# Patient Record
Sex: Female | Born: 1970 | Race: White | Hispanic: No | Smoking: Never smoker
Health system: Southern US, Community
[De-identification: ages and names within clinical notes are randomized; demographics above are authoritative.]

## PROBLEM LIST (undated history)

## (undated) ENCOUNTER — Inpatient Hospital Stay: Admission: EM | Payer: Self-pay | Source: Home / Self Care

## (undated) DIAGNOSIS — J45909 Unspecified asthma, uncomplicated: Secondary | ICD-10-CM

## (undated) HISTORY — PX: NO PAST SURGERIES: SHX2092

---

## 2005-07-19 ENCOUNTER — Emergency Department: Payer: Self-pay | Admitting: Emergency Medicine

## 2008-03-08 ENCOUNTER — Emergency Department: Payer: Self-pay | Admitting: Emergency Medicine

## 2016-02-21 ENCOUNTER — Emergency Department: Payer: BC Managed Care – PPO

## 2016-02-21 ENCOUNTER — Emergency Department
Admission: EM | Admit: 2016-02-21 | Discharge: 2016-02-21 | Disposition: A | Discharge status: Home / Self Care | Payer: BC Managed Care – PPO | Attending: Internal Medicine | Admitting: Internal Medicine

## 2016-02-21 DIAGNOSIS — R42 Dizziness and giddiness: Secondary | ICD-10-CM | POA: Insufficient documentation

## 2016-02-21 LAB — URINALYSIS COMPLETE WITH MICROSCOPIC (ARMC ONLY)
BILIRUBIN URINE: NEGATIVE
Bacteria, UA: NONE SEEN
GLUCOSE, UA: NEGATIVE mg/dL
Leukocytes, UA: NEGATIVE
Nitrite: NEGATIVE
PH: 5 (ref 5.0–8.0)
Protein, ur: 100 mg/dL — AB
Specific Gravity, Urine: 1.023 (ref 1.005–1.030)

## 2016-02-21 LAB — BASIC METABOLIC PANEL
ANION GAP: 6 (ref 5–15)
BUN: 18 mg/dL (ref 6–20)
CO2: 28 mmol/L (ref 22–32)
Calcium: 9.6 mg/dL (ref 8.9–10.3)
Chloride: 104 mmol/L (ref 101–111)
Creatinine, Ser: 0.79 mg/dL (ref 0.44–1.00)
GFR calc Af Amer: >60 mL/min (ref >60)
GFR calc non Af Amer: >60 mL/min (ref >60)
GLUCOSE: 101 mg/dL — AB (ref 65–99)
POTASSIUM: 4.1 mmol/L (ref 3.5–5.1)
Sodium: 138 mmol/L (ref 135–145)

## 2016-02-21 LAB — CBC
HEMATOCRIT: 41.2 % (ref 35.0–47.0)
HEMOGLOBIN: 13.6 g/dL (ref 12.0–16.0)
MCH: 27.4 pg (ref 26.0–34.0)
MCHC: 32.9 g/dL (ref 32.0–36.0)
MCV: 83.2 fL (ref 80.0–100.0)
PLATELETS: 185 10*3/uL (ref 150–440)
RBC: 4.95 MIL/uL (ref 3.80–5.20)
RDW: 14.3 % (ref 11.5–14.5)
WBC: 12 10*3/uL — ABNORMAL HIGH (ref 3.6–11.0)

## 2016-02-21 LAB — POCT PREGNANCY, URINE: PREG TEST UR: NEGATIVE

## 2016-02-21 LAB — GLUCOSE, CAPILLARY: GLUCOSE-CAPILLARY: 87 mg/dL (ref 65–99)

## 2016-02-21 NOTE — Discharge Instructions (Signed)

## 2016-02-21 NOTE — ED Provider Notes (Signed)
[facility] Emergency Department Provider Note     Time seen: ----------------------------------------- 10:05 PM on 02/21/2016 -----------------------------------------    I have reviewed the triage vital signs and the nursing notes.   HISTORY  Chief Complaint Dizziness    HPI Erin [name] is a 45 y.o. female who presents to the ER feeling somewhat feverish and not well since yesterday. Patient states symptoms recurred today around 11 AM, she recently has changed her diet and is exercising. Last meal was at 2 PM. Patient states she attempted workouts afternoon and had a near syncopal event. She states still feels weak and dizzy, is only eating fresh fruits and vegetables. She states she's lost 5 pounds in the last 3 weeks. Patient states she now she is overweight, she does not have any medical problems or take any medications.   No past medical history on file.  There are no active problems to display for this patient.   No past surgical history on file.  Allergies Review of patient's allergies indicates no known allergies.  Social History Social History  Substance Use Topics  . Smoking status: Not on file  . Smokeless tobacco: Not on file  . Alcohol Use: Not on file    Review of Systems Constitutional: Negative for fever. Eyes: Negative for visual changes. ENT: Negative for sore throat. Cardiovascular: Negative for chest pain. Respiratory: Negative for shortness of breath. Gastrointestinal: Negative for abdominal pain, vomiting and diarrhea. Genitourinary: Negative for dysuria. Musculoskeletal: Negative for back pain. Skin: Negative for rash. Neurological: Negative for headaches, positive for dizziness and weakness  10-point ROS otherwise negative.  ____________________________________________   PHYSICAL EXAM:  VITAL SIGNS: ED Triage Vitals  Enc Vitals Group     BP 02/21/16 1942 156/96 mmHg     Pulse Rate 02/21/16 1942 95      Resp 02/21/16 1942 20     Temp 02/21/16 1942 98.8 F (37.1 C)     Temp Source 02/21/16 1942 Oral     SpO2 02/21/16 1942 100 %     Weight 02/21/16 1942 223 lb (101.152 kg)     Height 02/21/16 1942 5' (1.524 m)     Head Cir --      Peak Flow --      Pain Score 02/21/16 1943 0     Pain Loc --      Pain Edu? --      Excl. in GC? --     Constitutional: Alert and oriented. Well appearing and in no distress. Eyes: Conjunctivae are normal. PERRL. Normal extraocular movements. ENT   Head: Normocephalic and atraumatic.   Nose: No congestion/rhinnorhea.   Mouth/Throat: Mucous membranes are moist.   Neck: No stridor. Cardiovascular: Normal rate, regular rhythm. Normal and symmetric distal pulses are present in all extremities. No murmurs, rubs, or gallops. Respiratory: Normal respiratory effort without tachypnea nor retractions. Breath sounds are clear and equal bilaterally. No wheezes/rales/rhonchi. Gastrointestinal: Soft and nontender. No distention. No abdominal bruits.  Musculoskeletal: Nontender with normal range of motion in all extremities. No joint effusions.  No lower extremity tenderness nor edema. Neurologic:  Normal speech and language. No gross focal neurologic deficits are appreciated. Speech is normal. No gait instability. Skin:  Skin is warm, dry and intact. No rash noted. Psychiatric: Mood and affect are normal. Speech and behavior are normal. Patient exhibits appropriate insight and judgment. ____________________________________________  EKG: Interpreted by me. Normal sinus rhythm with a rate of 75 bpm, normal PR interval, normal rest, normal  QT interval. Incomplete right bundle branch block, voltage criteria for LVH. ____________________________________________  ED COURSE:  Pertinent labs & imaging results that were available during my care of the patient were reviewed by me and considered in my medical decision making (see chart for details). Patient is no  acute distress, will check cardiac labs and chest x-ray. ____________________________________________    LABS (pertinent positives/negatives)  Labs Reviewed  BASIC METABOLIC PANEL - Abnormal; Notable for the following:    Glucose, Bld 101 (*)    All other components within normal limits  CBC - Abnormal; Notable for the following:    WBC 12.0 (*)    All other components within normal limits  URINALYSIS COMPLETEWITH MICROSCOPIC ([facility] ONLY) - Abnormal; Notable for the following:    Color, Urine YELLOW (*)    APPearance HAZY (*)    Ketones, ur TRACE (*)    Hgb urine dipstick 2+ (*)    Protein, ur 100 (*)    Squamous Epithelial / LPF 0-5 (*)    All other components within normal limits  GLUCOSE, CAPILLARY  CBG MONITORING, ED  POC URINE PREG, ED  POCT PREGNANCY, URINE    RADIOLOGY Images were viewed by me  Chest x-ray Is unremarkable ____________________________________________  FINAL ASSESSMENT AND PLAN  Dizziness  Plan: Patient with labs and imaging as dictated above. Patient was somewhat abnormal EKG that suggested LVH. I advised her no strenuous exercise and she'll be referred to cardiology to follow up as an outpatient. She is in no acute distress and stable for outpatient follow-up.   Emily Filbert, MD   Emily Filbert, MD 02/21/16 2222

## 2016-02-21 NOTE — ED Notes (Signed)
Pt states that she started feeling somewhat feverish and not well yesterday and it returned this am around 1100, pt states that she is on a new diet and last meal was at 1400, pt states that she attempted to work out this afternoon and nearly passed out, pt states that she feels weak and dizzy

## 2016-02-24 DIAGNOSIS — R42 Dizziness and giddiness: Secondary | ICD-10-CM | POA: Insufficient documentation

## 2016-02-24 DIAGNOSIS — R55 Syncope and collapse: Secondary | ICD-10-CM | POA: Insufficient documentation

## 2016-05-31 ENCOUNTER — Ambulatory Visit: Payer: BC Managed Care – PPO

## 2018-04-09 ENCOUNTER — Inpatient Hospital Stay
Admission: EM | Admit: 2018-04-09 | Discharge: 2018-04-10 | DRG: 193 | Disposition: A | Discharge status: Home / Self Care | Payer: BC Managed Care – PPO | Attending: Internal Medicine | Admitting: Internal Medicine

## 2018-04-09 ENCOUNTER — Other Ambulatory Visit: Payer: Self-pay

## 2018-04-09 ENCOUNTER — Encounter: Payer: Self-pay | Admitting: Emergency Medicine

## 2018-04-09 ENCOUNTER — Emergency Department: Payer: BC Managed Care – PPO

## 2018-04-09 DIAGNOSIS — J189 Pneumonia, unspecified organism: Principal | ICD-10-CM | POA: Diagnosis present

## 2018-04-09 DIAGNOSIS — J9600 Acute respiratory failure, unspecified whether with hypoxia or hypercapnia: Secondary | ICD-10-CM | POA: Diagnosis present

## 2018-04-09 DIAGNOSIS — Z79899 Other long term (current) drug therapy: Secondary | ICD-10-CM

## 2018-04-09 DIAGNOSIS — J4541 Moderate persistent asthma with (acute) exacerbation: Secondary | ICD-10-CM | POA: Diagnosis not present

## 2018-04-09 DIAGNOSIS — J9601 Acute respiratory failure with hypoxia: Secondary | ICD-10-CM

## 2018-04-09 HISTORY — DX: Unspecified asthma, uncomplicated: J45.909

## 2018-04-09 LAB — CBC
HEMATOCRIT: 40.2 % (ref 35.0–47.0)
HEMOGLOBIN: 13.8 g/dL (ref 12.0–16.0)
MCH: 27.9 pg (ref 26.0–34.0)
MCHC: 34.3 g/dL (ref 32.0–36.0)
MCV: 81.4 fL (ref 80.0–100.0)
Platelets: 205 10*3/uL (ref 150–440)
RBC: 4.94 MIL/uL (ref 3.80–5.20)
RDW: 14.4 % (ref 11.5–14.5)
WBC: 7.1 10*3/uL (ref 3.6–11.0)

## 2018-04-09 LAB — BASIC METABOLIC PANEL
ANION GAP: 10 (ref 5–15)
BUN: 11 mg/dL (ref 6–20)
CHLORIDE: 100 mmol/L — AB (ref 101–111)
CO2: 26 mmol/L (ref 22–32)
Calcium: 9.2 mg/dL (ref 8.9–10.3)
Creatinine, Ser: 0.73 mg/dL (ref 0.44–1.00)
GFR calc Af Amer: >60 mL/min (ref >60)
GFR calc non Af Amer: >60 mL/min (ref >60)
GLUCOSE: 126 mg/dL — AB (ref 65–99)
Potassium: 4 mmol/L (ref 3.5–5.1)
Sodium: 136 mmol/L (ref 135–145)

## 2018-04-09 LAB — TROPONIN I: Troponin I: <0.03 ng/mL (ref <0.03)

## 2018-04-09 LAB — POCT PREGNANCY, URINE: PREG TEST UR: NEGATIVE

## 2018-04-09 MED ORDER — IPRATROPIUM-ALBUTEROL 0.5-2.5 (3) MG/3ML IN SOLN
3.0000 mL | RESPIRATORY_TRACT | Status: DC
  Administered 2018-04-09 – 2018-04-10 (×6): 3 mL via RESPIRATORY_TRACT
  Filled 2018-04-09 (×6): qty 3

## 2018-04-09 MED ORDER — SODIUM CHLORIDE 0.9 % IV SOLN
1.0000 g | Freq: Every day | INTRAVENOUS | Status: DC
  Filled 2018-04-09: qty 10

## 2018-04-09 MED ORDER — DOCUSATE SODIUM 100 MG PO CAPS: 100.0000 mg | ORAL_CAPSULE | Freq: Two times a day (BID) | ORAL | Status: DC | PRN

## 2018-04-09 MED ORDER — LORATADINE 10 MG PO TABS
10.0000 mg | ORAL_TABLET | Freq: Every day | ORAL | Status: DC
Start: 2018-04-10 — End: 2018-04-10

## 2018-04-09 MED ORDER — BUDESONIDE 0.25 MG/2ML IN SUSP
0.2500 mg | Freq: Two times a day (BID) | RESPIRATORY_TRACT | Status: DC
  Administered 2018-04-10: 0.25 mg via RESPIRATORY_TRACT
  Filled 2018-04-09 (×2): qty 2

## 2018-04-09 MED ORDER — HEPARIN SODIUM (PORCINE) 5000 UNIT/ML IJ SOLN
5000.0000 [IU] | Freq: Three times a day (TID) | INTRAMUSCULAR | Status: DC
  Filled 2018-04-09: qty 1

## 2018-04-09 MED ORDER — METHYLPREDNISOLONE SODIUM SUCC 125 MG IJ SOLR
INTRAMUSCULAR | Status: AC
  Administered 2018-04-09: 125 mg via INTRAVENOUS
  Filled 2018-04-09: qty 2

## 2018-04-09 MED ORDER — DEXTROSE 5 % IV SOLN
250.0000 mg | Freq: Every day | INTRAVENOUS | Status: DC
  Filled 2018-04-09: qty 250

## 2018-04-09 MED ORDER — METHYLPREDNISOLONE SODIUM SUCC 125 MG IJ SOLR
60.0000 mg | Freq: Four times a day (QID) | INTRAMUSCULAR | Status: DC
  Administered 2018-04-09 – 2018-04-10 (×3): 60 mg via INTRAVENOUS
  Filled 2018-04-09 (×3): qty 2

## 2018-04-09 MED ORDER — METHYLPREDNISOLONE SODIUM SUCC 125 MG IJ SOLR
125.0000 mg | Freq: Once | INTRAMUSCULAR | Status: AC
  Administered 2018-04-09: 125 mg via INTRAVENOUS

## 2018-04-09 MED ORDER — IPRATROPIUM-ALBUTEROL 0.5-2.5 (3) MG/3ML IN SOLN
3.0000 mL | Freq: Once | RESPIRATORY_TRACT | Status: AC
  Administered 2018-04-09: 3 mL via RESPIRATORY_TRACT

## 2018-04-09 MED ORDER — AZITHROMYCIN 500 MG IV SOLR
500.0000 mg | Freq: Once | INTRAVENOUS | Status: AC
  Administered 2018-04-09: 500 mg via INTRAVENOUS
  Filled 2018-04-09: qty 500

## 2018-04-09 MED ORDER — IPRATROPIUM-ALBUTEROL 0.5-2.5 (3) MG/3ML IN SOLN
RESPIRATORY_TRACT | Status: AC
  Administered 2018-04-09: 3 mL via RESPIRATORY_TRACT
  Filled 2018-04-09: qty 3

## 2018-04-09 MED ORDER — IOPAMIDOL (ISOVUE-370) INJECTION 76%
75.0000 mL | Freq: Once | INTRAVENOUS | Status: AC | PRN
  Administered 2018-04-09: 60 mL via INTRAVENOUS

## 2018-04-09 MED ORDER — SODIUM CHLORIDE 0.9 % IV SOLN: 1.0000 g | Freq: Every day | INTRAVENOUS | Status: DC

## 2018-04-09 MED ORDER — SODIUM CHLORIDE 0.9 % IV SOLN
1.0000 g | Freq: Once | INTRAVENOUS | Status: AC
  Administered 2018-04-09: 1 g via INTRAVENOUS
  Filled 2018-04-09: qty 10

## 2018-04-09 NOTE — ED Provider Notes (Signed)
[facility] Emergency Department Provider Note   ____________________________________________    I have reviewed the triage vital signs and the nursing notes.   HISTORY  Chief Complaint Fever and Wheezing     HPI Erin [name] is a 47 y.o. female who presents with complaints of shortness of breath.  Patient reports a history of asthma typically has exacerbations in the fall and spring.  She reports she has been cleaning the house and thinks that the chemicals or dust may have exacerbated her asthma.  She complains of chills as well and suspect she has had a fever over the last 2 days.  Mild chest tightness, no nausea or vomiting or diaphoresis.  Has not taken anything for this   Past Medical History:  Diagnosis Date  . Asthma     There are no active problems to display for this patient.     Prior to Admission medications   Medication Sig Start Date End Date Taking? Authorizing Provider  albuterol (PROVENTIL HFA;VENTOLIN HFA) 108 (90 Base) MCG/ACT inhaler Inhale 1-2 puffs into the lungs every 6 (six) hours as needed for wheezing or shortness of breath.   Yes [provider]  fexofenadine (ALLEGRA) 180 MG tablet Take 180 mg by mouth daily.   Yes [provider]     Allergies Percocet [oxycodone-acetaminophen]  No family history on file.  Social History Does not smoke Occasional alcohol use  Review of Systems  Constitutional: Positive chills Eyes: No visual changes.  ENT: No sore throat. Cardiovascular: Mild chest tightness Respiratory: As above Gastrointestinal: No abdominal pain.  No nausea, no vomiting.   Genitourinary: Negative for dysuria. Musculoskeletal: Negative for back pain. Skin: Negative for rash. Neurological: Negative for headaches    ____________________________________________   PHYSICAL EXAM:  VITAL SIGNS: ED Triage Vitals  Enc Vitals Group     BP 04/09/18 0940 (!) 159/90     Pulse  Rate 04/09/18 0940 (!) 118     Resp 04/09/18 0940 16     Temp 04/09/18 0940 99.6 F (37.6 C)     Temp Source 04/09/18 0940 Oral     SpO2 04/09/18 0940 93 %     Weight 04/09/18 0939 113.4 kg (250 lb)     Height 04/09/18 0939 1.524 m (5')     Head Circumference --      Peak Flow --      Pain Score 04/09/18 0938 0     Pain Loc --      Pain Edu? --      Excl. in GC? --     Constitutional: Alert and oriented. No acute distress. Pleasant and interactive Eyes: Conjunctivae are normal.   Nose: No congestion/rhinnorhea. Mouth/Throat: Mucous membranes are moist.   Cardiovascular: Normal rate, regular rhythm. Grossly normal heart sounds.  Good peripheral circulation. Respiratory: Normal respiratory effort.  No retractions.  Diffuse wheezing Gastrointestinal: Soft and nontender. No distention.  No CVA tenderness.  Musculoskeletal: No lower extremity tenderness nor edema.  Warm and well perfused Neurologic:  Normal speech and language. No gross focal neurologic deficits are appreciated.  Skin:  Skin is warm, dry and intact. No rash noted. Psychiatric: Mood and affect are normal. Speech and behavior are normal.  ____________________________________________   LABS (all labs ordered are listed, but only abnormal results are displayed)  Labs Reviewed  BASIC METABOLIC PANEL - Abnormal; Notable for the following components:      Result Value   Chloride 100 (*)  Glucose, Bld 126 (*)    All other components within normal limits  CULTURE, BLOOD (ROUTINE X 2)  CULTURE, BLOOD (ROUTINE X 2)  CBC  TROPONIN I  POC URINE PREG, ED  POCT PREGNANCY, URINE   ____________________________________________  EKG  ED ECG REPORT I, Jene Every, the attending physician, personally viewed and interpreted this ECG.  Date: 04/09/2018  Rate: 114 Rhythm: Sinus tachycardia QRS Axis: normal Intervals: normal ST/T Wave abnormalities: normal Narrative Interpretation: no evidence of acute  ischemia  ____________________________________________  RADIOLOGY  Chest x-ray shows nodular appearance of the right hilum, will obtain CT ____________________________________________   PROCEDURES  Procedure(s) performed: No  Procedures   Critical Care performed: No ____________________________________________   INITIAL IMPRESSION / ASSESSMENT AND PLAN / ED COURSE  Pertinent labs & imaging results that were available during my care of the patient were reviewed by me and considered in my medical decision making (see chart for details).  Patient presents with shortness of breath and wheezing consistent with asthma exacerbation but also describes chills raising a concern for infection versus pneumonia.  Chest x-ray with nodular hilum will obtain CT, treat with Solu-Medrol IV, duo nebs and reevaluate  CT scan demonstrates pneumonia, patient remains with increased work of breathing, tachycardic, pulse oximetry in the low 90s.  Will admit to the hospital service, IV Rocephin IV azithromycin    ____________________________________________   FINAL CLINICAL IMPRESSION(S) / ED DIAGNOSES  Final diagnoses:  Community acquired pneumonia of right lung, unspecified part of lung  Moderate persistent asthma with exacerbation        Note:  This document was prepared using Dragon voice recognition software and may include unintentional dictation errors.    Jene Every, MD 04/09/18 1341

## 2018-04-09 NOTE — ED Triage Notes (Signed)
C/O wheezing, SOB since Friday. Also C/O productive cough.  Using nebulizer every 4 hours.

## 2018-04-09 NOTE — ED Notes (Signed)
Attempted to call for triage. Pt remains in bathroom. Will triage when done.

## 2018-04-09 NOTE — H&P (Signed)
[employer] [facility] [facility] [place] [facility]   PATIENT NAME: Erin [name]    MR#:  [id number]  DATE OF BIRTH:  [birth year]  DATE OF ADMISSION:  04/09/2018  PRIMARY CARE PHYSICIAN: Patient, No Pcp Per   REQUESTING/REFERRING PHYSICIAN: Kinner  CHIEF COMPLAINT:   Chief Complaint  Patient presents with  . Fever  . Wheezing    HISTORY OF PRESENT ILLNESS: Erin [name]  is a 47 y.o. female with a known history of asthma- at her baseline has shortness of breath with minimal exertion and she tried to limit her activity is secondary to that. She still her to use her rescue inhalers most of the days. For last 3-4 days she is worsening in shortness of breath with some cough and productive sputum. She also have a low-grade fever and chills. She continued to use her nebulizer albuterol at home, but it was still not helping and so finally came to emergency room. Noted to havesome infiltrate on CT scan of the chest and she was tachycardic and tachypneic so given to hospitalist team for further management of respiratory failure, pneumonia, asthma.  PAST MEDICAL HISTORY:   Past Medical History:  Diagnosis Date  . Asthma     PAST SURGICAL HISTORY: C section.  SOCIAL HISTORY:  Social History   Tobacco Use  . Smoking status: Never Smoker  . Smokeless tobacco: Never Used  Substance Use Topics  . Alcohol use: Not Currently    FAMILY HISTORY:  Family History  Problem Relation Age of Onset  . Diabetes Sister     DRUG ALLERGIES:  Allergies  Allergen Reactions  . Percocet [Oxycodone-Acetaminophen] Itching    "Under the skin bugs"    REVIEW OF SYSTEMS:   CONSTITUTIONAL: positive for fever, fatigue or weakness.  EYES: No blurred or double vision.  EARS, NOSE, AND THROAT: No tinnitus or ear pain.  RESPIRATORY: have cough, shortness of breath, wheezing ,no hemoptysis.  CARDIOVASCULAR: No chest pain, orthopnea, edema.  GASTROINTESTINAL: No nausea, vomiting, diarrhea or  abdominal pain.  GENITOURINARY: No dysuria, hematuria.  ENDOCRINE: No polyuria, nocturia,  HEMATOLOGY: No anemia, easy bruising or bleeding SKIN: No rash or lesion. MUSCULOSKELETAL: No joint pain or arthritis.   NEUROLOGIC: No tingling, numbness, weakness.  PSYCHIATRY: No anxiety or depression.   MEDICATIONS AT HOME:  Prior to Admission medications   Medication Sig Start Date End Date Taking? Authorizing Provider  albuterol (PROVENTIL HFA;VENTOLIN HFA) 108 (90 Base) MCG/ACT inhaler Inhale 1-2 puffs into the lungs every 6 (six) hours as needed for wheezing or shortness of breath.   Yes [provider]  fexofenadine (ALLEGRA) 180 MG tablet Take 180 mg by mouth daily.   Yes [provider]      PHYSICAL EXAMINATION:   VITAL SIGNS: Blood pressure 136/83, pulse (!) 104, temperature 99.6 F (37.6 C), temperature source Oral, resp. rate (!) 21, height 5' (1.524 m), weight 113.4 kg (250 lb), last menstrual period 02/26/2018, SpO2 90 %.  GENERAL:  47 y.o.-year-old patient lying in the bed with no acute distress.  EYES: Pupils equal, round, reactive to light and accommodation. No scleral icterus. Extraocular muscles intact.  HEENT: [name] atraumatic, normocephalic. Oropharynx and nasopharynx clear.  NECK:  Supple, no jugular venous distention. No thyroid enlargement, no tenderness.  LUNGS: Normal breath sounds bilaterally, b/l wheezing, no crepitation. No use of accessory muscles of respiration.  CARDIOVASCULAR: S1, S2 normal. No murmurs, rubs, or gallops.  ABDOMEN: Soft, nontender, nondistended. Bowel sounds present. No organomegaly or mass.  EXTREMITIES: No pedal edema, cyanosis, or clubbing.  NEUROLOGIC: Cranial nerves II through XII are intact. Muscle strength 5/5 in all extremities. Sensation intact. Gait not checked.  PSYCHIATRIC: The patient is alert and oriented x 3.  SKIN: No obvious rash, lesion, or ulcer.   LABORATORY PANEL:   CBC Recent Labs  Lab 04/09/18 0951   WBC 7.1  HGB 13.8  HCT 40.2  PLT 205  MCV 81.4  MCH 27.9  MCHC 34.3  RDW 14.4   ------------------------------------------------------------------------------------------------------------------  Chemistries  Recent Labs  Lab 04/09/18 0951  NA 136  K 4.0  CL 100*  CO2 26  GLUCOSE 126*  BUN 11  CREATININE 0.73  CALCIUM 9.2   ------------------------------------------------------------------------------------------------------------------ estimated creatinine clearance is 101.9 mL/min (by C-G formula based on SCr of 0.73 mg/dL). ------------------------------------------------------------------------------------------------------------------ No results for input(s): TSH, T4TOTAL, T3FREE, THYROIDAB in the last 72 hours.  Invalid input(s): FREET3   Coagulation profile No results for input(s): INR, PROTIME in the last 168 hours. ------------------------------------------------------------------------------------------------------------------- No results for input(s): DDIMER in the last 72 hours. -------------------------------------------------------------------------------------------------------------------  Cardiac Enzymes Recent Labs  Lab 04/09/18 0951  TROPONINI <0.03   ------------------------------------------------------------------------------------------------------------------ Invalid input(s): POCBNP  ---------------------------------------------------------------------------------------------------------------  Urinalysis    Component Value Date/Time   COLORURINE YELLOW (A) 02/21/2016 1945   APPEARANCEUR HAZY (A) 02/21/2016 1945   LABSPEC 1.023 02/21/2016 1945   PHURINE 5.0 02/21/2016 1945   GLUCOSEU NEGATIVE 02/21/2016 1945   HGBUR 2+ (A) 02/21/2016 1945   BILIRUBINUR NEGATIVE 02/21/2016 1945   KETONESUR TRACE (A) 02/21/2016 1945   PROTEINUR 100 (A) 02/21/2016 1945   NITRITE NEGATIVE 02/21/2016 1945   LEUKOCYTESUR NEGATIVE 02/21/2016 1945      RADIOLOGY: Dg Chest 2 View  Result Date: 04/09/2018 CLINICAL DATA:  Shortness of breath.  Productive cough.  Wheezing. EXAM: CHEST - 2 VIEW COMPARISON:  02/21/2016 FINDINGS: There is abnormal enlargement of the right hilum. Slight peribronchial thickening. The lungs are otherwise clear. No effusions. Heart size and vascularity are normal. No acute bone abnormality. 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 IMPRESSION: 1. Nodular appearance in the right hilum, new since the prior exam. CT scan of the chest with contrast recommended for further characterization. 2. Slight bronchitic changes. Electronically Signed   By: Francene Boyers M.D.   On: 04/09/2018 10:24   Ct Chest W Contrast  Result Date: 04/09/2018 CLINICAL DATA:  Shortness of breath, productive cough. EXAM: CT CHEST WITH CONTRAST TECHNIQUE: Multidetector CT imaging of the chest was performed during intravenous contrast administration. CONTRAST:  60mL ISOVUE-370 IOPAMIDOL (ISOVUE-370) INJECTION 76% COMPARISON:  Radiographs of same day. FINDINGS: Cardiovascular: No significant vascular findings. Normal heart size. No pericardial effusion. Mediastinum/Nodes: No enlarged mediastinal, hilar, or axillary lymph nodes. Thyroid gland, trachea, and esophagus demonstrate no significant findings. Lungs/Pleura: No pneumothorax or pleural effusion is noted. Left lung is clear. Airspace opacity is noted medially and posteriorly in right lower lobe most consistent with pneumonia or possibly atelectasis. 1 cm irregular density is noted more superiorly in the right lower lobe most consistent with focal inflammation, although neoplasm cannot be excluded. Upper Abdomen: No acute abnormality. Musculoskeletal: No chest wall abnormality. No acute or significant osseous findings. IMPRESSION: Airspace opacity is noted medially and posteriorly in right lower lobe most consistent with  pneumonia or possibly subsegmental atelectasis. 1 cm irregular density is noted more superiorly in right lower lobe which may represent focal inflammation, but neoplasm cannot be excluded. Follow-up unenhanced chest CT  in 6-8 weeks is recommended to ensure resolution and rule out underlying malignancy. Electronically Signed   By: Lupita Raider, M.D.   On: 04/09/2018 12:12    EKG: Orders placed or performed during the hospital encounter of 04/09/18  . ED EKG within 10 minutes  . ED EKG within 10 minutes  . EKG 12-Lead  . EKG 12-Lead    IMPRESSION AND PLAN:  * acute hypoxic respiratory   Secondary to community-acquired pneumonia   Asthma exacerbation    IV and inhaled steroids, inhaled nebulizer therapy   Antibiotics.   Supplemental oxygen for now and try to taper.   As her asthma symptoms arerequiring her to use rescue inhaler almost daily and limiting her day to day activities, she may need long-acting inhalers on discharge. And may need to have a pulmonary clinic follow-up  * lung nodule   This is incidental finding on CT scan, need to follow up in few months as outpatient.  All the records are reviewed and case discussed with ED provider. Management plans discussed with the patient, family and they are in agreement.  CODE STATUS: full.    TOTAL TIME TAKING CARE OF THIS PATIENT: 45 minutes.    Altamese Dilling M.D on 04/09/2018   Between 7am to 6pm - Pager - [phone]  After 6pm go to [email or web address] - password [email or web address]  [facility]  Office  [phone]  CC: Primary care physician; Patient, No Pcp Per   Note: This dictation was prepared with Dragon dictation along with smaller phrase technology. Any transcriptional errors that result from this process are unintentional.

## 2018-04-10 LAB — CBC
HCT: 37.3 % (ref 35.0–47.0)
HEMOGLOBIN: 13 g/dL (ref 12.0–16.0)
MCH: 28.2 pg (ref 26.0–34.0)
MCHC: 34.8 g/dL (ref 32.0–36.0)
MCV: 81 fL (ref 80.0–100.0)
PLATELETS: 168 10*3/uL (ref 150–440)
RBC: 4.6 MIL/uL (ref 3.80–5.20)
RDW: 14.5 % (ref 11.5–14.5)
WBC: 6.5 10*3/uL (ref 3.6–11.0)

## 2018-04-10 LAB — BASIC METABOLIC PANEL
ANION GAP: 12 (ref 5–15)
BUN: 15 mg/dL (ref 6–20)
CALCIUM: 9.3 mg/dL (ref 8.9–10.3)
CO2: 25 mmol/L (ref 22–32)
CREATININE: 0.75 mg/dL (ref 0.44–1.00)
Chloride: 101 mmol/L (ref 101–111)
Glucose, Bld: 216 mg/dL — ABNORMAL HIGH (ref 65–99)
Potassium: 3.9 mmol/L (ref 3.5–5.1)
Sodium: 138 mmol/L (ref 135–145)

## 2018-04-10 MED ORDER — ALBUTEROL SULFATE HFA 108 (90 BASE) MCG/ACT IN AERS: 2.0000 | INHALATION_SPRAY | Freq: Four times a day (QID) | RESPIRATORY_TRACT | 2 refills | Status: DC | PRN

## 2018-04-10 MED ORDER — METHYLPREDNISOLONE SODIUM SUCC 40 MG IJ SOLR
40.0000 mg | Freq: Two times a day (BID) | INTRAMUSCULAR | Status: DC
  Administered 2018-04-10: 13:00:00 40 mg via INTRAVENOUS
  Filled 2018-04-10: qty 1

## 2018-04-10 MED ORDER — PREDNISONE 10 MG PO TABS: 10.0000 mg | ORAL_TABLET | Freq: Every day | ORAL | 0 refills | Status: DC

## 2018-04-10 MED ORDER — AZITHROMYCIN 500 MG PO TABS: 500.0000 mg | ORAL_TABLET | Freq: Every day | ORAL | 0 refills | Status: AC

## 2018-04-10 MED ORDER — IPRATROPIUM-ALBUTEROL 0.5-2.5 (3) MG/3ML IN SOLN: 3.0000 mL | Freq: Four times a day (QID) | RESPIRATORY_TRACT | 0 refills | Status: DC | PRN

## 2018-04-10 MED ORDER — FLUTICASONE-SALMETEROL 250-50 MCG/DOSE IN AEPB: 1.0000 | INHALATION_SPRAY | Freq: Two times a day (BID) | RESPIRATORY_TRACT | 11 refills | Status: AC

## 2018-04-10 MED ORDER — CEFUROXIME AXETIL 500 MG PO TABS: 500.0000 mg | ORAL_TABLET | Freq: Two times a day (BID) | ORAL | 0 refills | Status: DC

## 2018-04-10 MED ORDER — TIOTROPIUM BROMIDE MONOHYDRATE 18 MCG IN CAPS
18.0000 ug | ORAL_CAPSULE | Freq: Every morning | RESPIRATORY_TRACT | Status: DC
  Administered 2018-04-10: 13:00:00 18 ug via RESPIRATORY_TRACT
  Filled 2018-04-10: qty 5

## 2018-04-10 NOTE — Progress Notes (Signed)
   04/10/18 1025  Clinical Encounter Type  Visited With Patient  Visit Type Initial  Referral From Nurse  Consult/Referral To Chaplain  Spiritual Encounters  Spiritual Needs Brochure;Emotional   CH received an OR to educate PT for an AD. PT received paperwork and will complete during her stay. CH and PT discussed her breathing problems. PT was very positive about future. CH will follow up with PT.

## 2018-04-10 NOTE — Progress Notes (Signed)
Discharge instructions given and went over with patient at bedside. Prescriptions given and reviewed. All questions answered. Patient verbalized understanding. Patient discharged home via wheelchair by volunteer services. Wessley Emert S, RN  

## 2018-04-10 NOTE — Progress Notes (Signed)
SATURATION QUALIFICATIONS: (This note is used to comply with regulatory documentation for home oxygen)  Patient Saturations on Room Air at Rest = 92%  Patient Saturations on Room Air while Ambulating = 89%  Bo Mcclintock, RN

## 2018-04-10 NOTE — Progress Notes (Signed)
Inpatient Diabetes Program Recommendations  AACE/ADA: New Consensus Statement on Inpatient Glycemic Control (2015)  Target Ranges:  Prepandial:   less than 140 mg/dL      Peak postprandial:   less than 180 mg/dL (1-2 hours)      Critically ill patients:  140 - 180 mg/dL   Lab Results  Component Value Date   GLUCAP 87 02/21/2016    Review of Glycemic ControlResults for Erin [name], Erin [name] (MRN [id number]) as of 04/10/2018 11:09  Ref. Range 04/09/2018 09:51 04/10/2018 04:52  Glucose Latest Ref Range: 65 - 99 mg/dL 409 (H) 811 (H)    Diabetes history: None Outpatient Diabetes medications:  None Current orders for Inpatient glycemic control:  Solumedrol 40 mg IV q 12 hours Inpatient Diabetes Program Recommendations:   Note blood sugars increased with IV steroids.  Consider adding Novolog moderate correction tid with meals and HS while on IV steroids and in the hospital.   Thanks,  Beryl Meager, RN, BC-ADM Inpatient Diabetes Coordinator Pager [phone] (8a-5p)

## 2018-04-10 NOTE — Discharge Summary (Addendum)
[employer] -  at [facility]   PATIENT NAME: Erin [name]    MR#:  [id number]  DATE OF BIRTH:  01-19-1971  DATE OF ADMISSION:  04/09/2018 ADMITTING PHYSICIAN: Altamese Dilling, MD  DATE OF DISCHARGE: April 10, 2018  PRIMARY CARE PHYSICIAN: [facility]    ADMISSION DIAGNOSIS:  Moderate persistent asthma with exacerbation [J45.41] Community acquired pneumonia of right lung, unspecified part of lung [J18.9]  DISCHARGE DIAGNOSIS:  Principal Problem:   Acute respiratory failure with hypoxia (HCC) Active Problems:   Acute respiratory failure (HCC)   SECONDARY DIAGNOSIS:   Past Medical History:  Diagnosis Date  . Asthma     HOSPITAL COURSE:   47 year old female with a history of childhood asthma who presents with shortness of breath and cough.  1.  Acute hypoxic respiratory failure in the setting of community-acquired pneumonia with mild exacerbation of asthma.  2.  Community acquired pneumonia: Patient will be discharged on oral Ceftin and azithromycin.  3.  Mild exacerbation of asthma: Patient will be discharged on prednisone taper I have also ordered Advair and rescue inhaler with albuterol.   4. 1 cm irregular density is noted more superiorly in right lower lobe which may represent focal inflammation, but neoplasm cannot be excluded. Follow-up unenhanced chest CT in 6-8 weeks is recommended to ensure resolution and rule out underlying malignancy  DISCHARGE CONDITIONS AND DIET:   Stable for discharge on regular diet  CONSULTS OBTAINED:    DRUG ALLERGIES:   Allergies  Allergen Reactions  . Percocet [Oxycodone-Acetaminophen] Itching    "Under the skin bugs"    DISCHARGE MEDICATIONS:   Allergies as of 04/10/2018      Reactions   Percocet [oxycodone-acetaminophen] Itching   "Under the skin bugs"      Medication List    TAKE these medications   albuterol 108 (90 Base) MCG/ACT inhaler Commonly known as:  PROVENTIL  HFA;VENTOLIN HFA Inhale 1-2 puffs into the lungs every 6 (six) hours as needed for wheezing or shortness of breath. What changed:  Another medication with the same name was added. Make sure you understand how and when to take each.   albuterol 108 (90 Base) MCG/ACT inhaler Commonly known as:  PROVENTIL HFA;VENTOLIN HFA Inhale 2 puffs into the lungs every 6 (six) hours as needed for wheezing or shortness of breath. What changed:  You were already taking a medication with the same name, and this prescription was added. Make sure you understand how and when to take each.   azithromycin 500 MG tablet Commonly known as:  ZITHROMAX Take 1 tablet (500 mg total) by mouth daily for 4 days. Take 1 tablet daily for 4 days.   cefUROXime 500 MG tablet Commonly known as:  CEFTIN Take 1 tablet (500 mg total) by mouth 2 (two) times daily with a meal.   fexofenadine 180 MG tablet Commonly known as:  ALLEGRA Take 180 mg by mouth daily.   Fluticasone-Salmeterol 250-50 MCG/DOSE Aepb Commonly known as:  ADVAIR DISKUS Inhale 1 puff into the lungs 2 (two) times daily.   predniSONE 10 MG tablet Commonly known as:  DELTASONE Take 1 tablet (10 mg total) by mouth daily with breakfast. 60 mg PO (ORAL) x 2 days 50 mg PO (ORAL)  x 2 days 40 mg PO (ORAL)  x 2 days 30 mg PO  (ORAL)  x 2 days 20 mg PO  (ORAL) x 2 days 10 mg PO  (ORAL) x 2 days then stop  Durable Medical Equipment  (From admission, onward)        Start     Ordered   04/10/18 1123  DME Oxygen  Once    Question Answer Comment  Mode or (Route) Nasal cannula   Liters per Minute 2   Frequency Continuous (stationary and portable oxygen unit needed)   Oxygen conserving device Yes   Oxygen delivery system Gas      04/10/18 1122        Today   CHIEF COMPLAINT:   No acute events overnight.  Patient reports no shortness of breath.  She did ambulate and her oxygen level was low.  She prefers to go home with home oxygen if  needed.  She does not want to stay in the hospital.   VITAL SIGNS:  Blood pressure (!) 146/85, pulse (!) 124, temperature 99 F (37.2 C), temperature source Oral, resp. rate 20, height 5' (1.524 m), weight 113.4 kg (250 lb), last menstrual period 02/26/2018, SpO2 95 %.   REVIEW OF SYSTEMS:  Review of Systems  Constitutional: Negative.  Negative for chills, fever and malaise/fatigue.  HENT: Negative.  Negative for ear discharge, ear pain, hearing loss, nosebleeds and sore throat.   Eyes: Negative.  Negative for blurred vision and pain.  Respiratory: Positive for cough. Negative for hemoptysis, shortness of breath and wheezing.   Cardiovascular: Negative.  Negative for chest pain, palpitations and leg swelling.  Gastrointestinal: Negative.  Negative for abdominal pain, blood in stool, diarrhea, nausea and vomiting.  Genitourinary: Negative.  Negative for dysuria.  Musculoskeletal: Negative.  Negative for back pain.  Skin: Negative.   Neurological: Negative for dizziness, tremors, speech change, focal weakness, seizures and headaches.  Endo/Heme/Allergies: Negative.  Does not bruise/bleed easily.  Psychiatric/Behavioral: Negative.  Negative for depression, hallucinations and suicidal ideas.     PHYSICAL EXAMINATION:  GENERAL:  47 y.o.-year-old patient lying in the bed with no acute distress.  NECK:  Supple, no jugular venous distention. No thyroid enlargement, no tenderness.  LUNGS: Normal breath sounds bilaterally, no wheezing, rales,rhonchi  No use of accessory muscles of respiration.  CARDIOVASCULAR: S1, S2 normal. No murmurs, rubs, or gallops.  ABDOMEN: Soft, non-tender, non-distended. Bowel sounds present. No organomegaly or mass.  EXTREMITIES: No pedal edema, cyanosis, or clubbing.  PSYCHIATRIC: The patient is alert and oriented x 3.  SKIN: No obvious rash, lesion, or ulcer.   DATA REVIEW:   CBC Recent Labs  Lab 04/10/18 0452  WBC 6.5  HGB 13.0  HCT 37.3  PLT 168     Chemistries  Recent Labs  Lab 04/10/18 0452  NA 138  K 3.9  CL 101  CO2 25  GLUCOSE 216*  BUN 15  CREATININE 0.75  CALCIUM 9.3    Cardiac Enzymes Recent Labs  Lab 04/09/18 0951  TROPONINI <0.03    Microbiology Results  @  RADIOLOGY:  Dg Chest 2 View  Result Date: 04/09/2018 CLINICAL DATA:  Shortness of breath.  Productive cough.  Wheezing. EXAM: CHEST - 2 VIEW COMPARISON:  02/21/2016 FINDINGS: There is abnormal enlargement of the right hilum. Slight peribronchial thickening. The lungs are otherwise clear. No effusions. Heart size and vascularity are normal. No acute bone abnormality. 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 IMPRESSION: 1. Nodular appearance in the right hilum, new since the prior exam. CT scan of the chest with contrast recommended for further characterization. 2. Slight bronchitic changes. Electronically Signed  By: Francene Boyers M.D.   On: 04/09/2018 10:24   Ct Chest W Contrast  Result Date: 04/09/2018 CLINICAL DATA:  Shortness of breath, productive cough. EXAM: CT CHEST WITH CONTRAST TECHNIQUE: Multidetector CT imaging of the chest was performed during intravenous contrast administration. CONTRAST:  60mL ISOVUE-370 IOPAMIDOL (ISOVUE-370) INJECTION 76% COMPARISON:  Radiographs of same day. FINDINGS: Cardiovascular: No significant vascular findings. Normal heart size. No pericardial effusion. Mediastinum/Nodes: No enlarged mediastinal, hilar, or axillary lymph nodes. Thyroid gland, trachea, and esophagus demonstrate no significant findings. Lungs/Pleura: No pneumothorax or pleural effusion is noted. Left lung is clear. Airspace opacity is noted medially and posteriorly in right lower lobe most consistent with pneumonia or possibly atelectasis. 1 cm irregular density is noted more superiorly in the right lower lobe most consistent with focal inflammation, although  neoplasm cannot be excluded. Upper Abdomen: No acute abnormality. Musculoskeletal: No chest wall abnormality. No acute or significant osseous findings. IMPRESSION: Airspace opacity is noted medially and posteriorly in right lower lobe most consistent with pneumonia or possibly subsegmental atelectasis. 1 cm irregular density is noted more superiorly in right lower lobe which may represent focal inflammation, but neoplasm cannot be excluded. Follow-up unenhanced chest CT in 6-8 weeks is recommended to ensure resolution and rule out underlying malignancy. Electronically Signed   By: Lupita Raider, M.D.   On: 04/09/2018 12:12      Allergies as of 04/10/2018      Reactions   Percocet [oxycodone-acetaminophen] Itching   "Under the skin bugs"      Medication List    TAKE these medications   albuterol 108 (90 Base) MCG/ACT inhaler Commonly known as:  PROVENTIL HFA;VENTOLIN HFA Inhale 1-2 puffs into the lungs every 6 (six) hours as needed for wheezing or shortness of breath. What changed:  Another medication with the same name was added. Make sure you understand how and when to take each.   albuterol 108 (90 Base) MCG/ACT inhaler Commonly known as:  PROVENTIL HFA;VENTOLIN HFA Inhale 2 puffs into the lungs every 6 (six) hours as needed for wheezing or shortness of breath. What changed:  You were already taking a medication with the same name, and this prescription was added. Make sure you understand how and when to take each.   azithromycin 500 MG tablet Commonly known as:  ZITHROMAX Take 1 tablet (500 mg total) by mouth daily for 4 days. Take 1 tablet daily for 4 days.   cefUROXime 500 MG tablet Commonly known as:  CEFTIN Take 1 tablet (500 mg total) by mouth 2 (two) times daily with a meal.   fexofenadine 180 MG tablet Commonly known as:  ALLEGRA Take 180 mg by mouth daily.   Fluticasone-Salmeterol 250-50 MCG/DOSE Aepb Commonly known as:  ADVAIR DISKUS Inhale 1 puff into the lungs 2  (two) times daily.   predniSONE 10 MG tablet Commonly known as:  DELTASONE Take 1 tablet (10 mg total) by mouth daily with breakfast. 60 mg PO (ORAL) x 2 days 50 mg PO (ORAL)  x 2 days 40 mg PO (ORAL)  x 2 days 30 mg PO  (ORAL)  x 2 days 20 mg PO  (ORAL) x 2 days 10 mg PO  (ORAL) x 2 days then stop            Durable Medical Equipment  (From admission, onward)        Start     Ordered   04/10/18 1123  DME Oxygen  Once    Question  Answer Comment  Mode or (Route) Nasal cannula   Liters per Minute 2   Frequency Continuous (stationary and portable oxygen unit needed)   Oxygen conserving device Yes   Oxygen delivery system Gas      04/10/18 1122        Management plans discussed with the patient and she is in agreement. Stable for discharge   Patient should follow up with pcp  CODE STATUS:     Code Status Orders  (From admission, onward)        Start     Ordered   04/09/18 1508  Full code  Continuous     04/09/18 1507    Code Status History    This patient has a current code status but no historical code status.      TOTAL TIME TAKING CARE OF THIS PATIENT: 38 minutes.    Note: This dictation was prepared with Dragon dictation along with smaller phrase technology. Any transcriptional errors that result from this process are unintentional.  Eureka Valdes M.D on 04/10/2018 at 11:24 AM  Between 7am to 6pm - Pager - [phone] After 6pm go to [email or web address] - Social research officer, government  [facility]  Office  [phone]  CC: Primary care physician; [facility]

## 2018-04-11 LAB — HIV ANTIBODY (ROUTINE TESTING W REFLEX): HIV SCREEN 4TH GENERATION: NONREACTIVE

## 2018-04-14 LAB — CULTURE, BLOOD (ROUTINE X 2)
CULTURE: NO GROWTH
Culture: NO GROWTH
Special Requests: ADEQUATE
Special Requests: ADEQUATE

## 2018-04-16 ENCOUNTER — Telehealth: Payer: Self-pay | Admitting: Licensed Clinical Social Worker

## 2018-04-16 NOTE — Telephone Encounter (Signed)
EMMI flagged patient for answering yes to feeling sad/hopeless/anxious/empty and yes to loss of interest in things. Clinical Child psychotherapist (CSW) attempted to contact patient via telephone however she did not answer and a voicemail was left.   [employer], LCSW [phone]

## 2018-04-17 ENCOUNTER — Telehealth: Payer: Self-pay | Admitting: Licensed Clinical Social Worker

## 2018-04-17 NOTE — Telephone Encounter (Signed)
EMMI flagged patient for answering yes to feeling sad/hopeless/anxious/empty and yes to loss of interest in things. Clinical Child psychotherapist (CSW) was able to reach patient via telephone on the second attempt today. Patient reported that she is doing good and is feeling much better. Patient reported that she is going to see her PCP today. Patient reported that she is not feeling sad or depressed and she thought she answered no to that question. Patient reported no needs or concerns. No future call is needed.   [employer], LCSW [phone]

## 2018-09-08 IMAGING — CT CT CHEST W/ CM
2 of 3 series · 15 of 36 positions shown, 18 images · IV contrast (iopamidol)
Comparison: Radiographs of same day.

CLINICAL DATA: Shortness of breath, productive cough.

EXAM:
CT CHEST WITH CONTRAST
TECHNIQUE: Multidetector CT imaging of the chest was performed during
intravenous contrast administration.
CONTRAST:  60mL ENFJ3O-YHY IOPAMIDOL (ENFJ3O-YHY) INJECTION 76%

[Series 2: axial st · axial · 0.66mm/px · z∈[-310,-82]mm · 12 of 134 slices shown, 15 images]
[im 10/134  mediastinal]
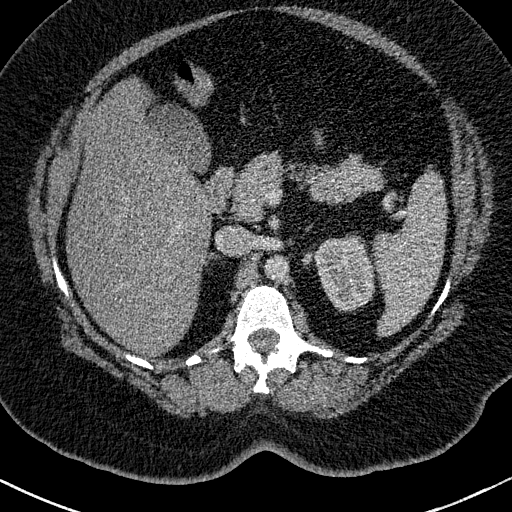
[im 10/134  lung]
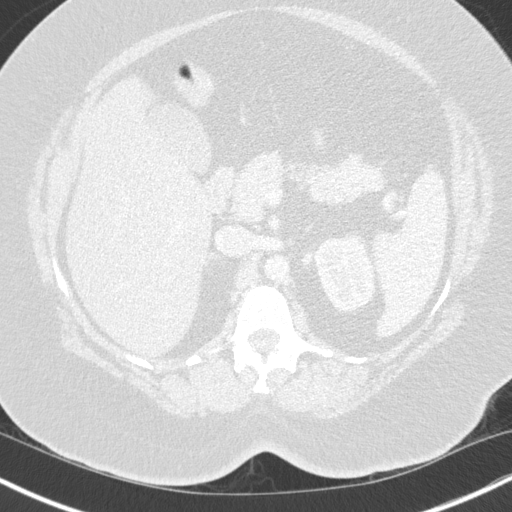
[im 20/134  lung]
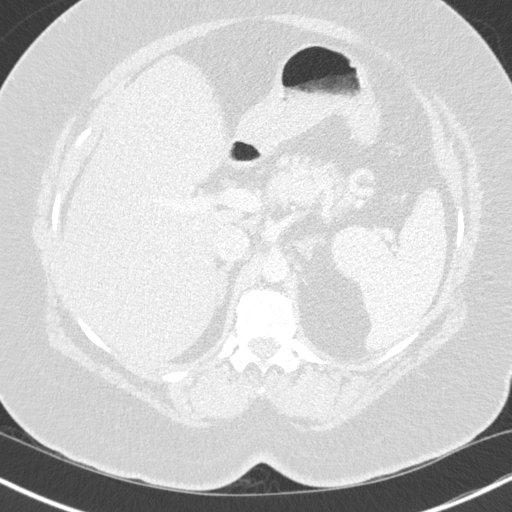
[im 30/134  lung]
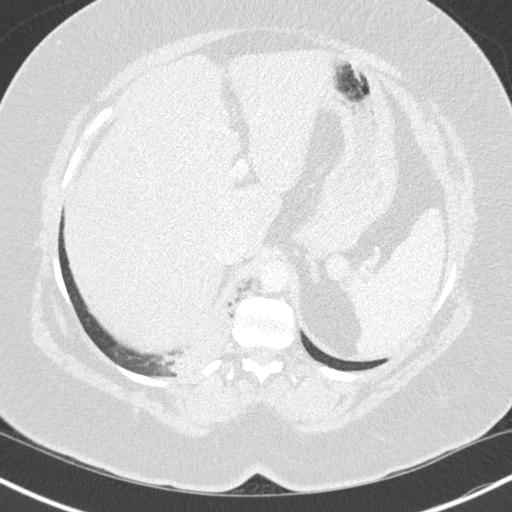
[im 40/134  lung]
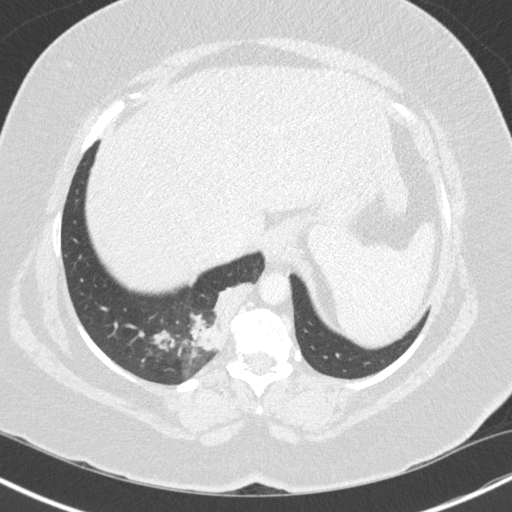
[im 50/134  mediastinal]
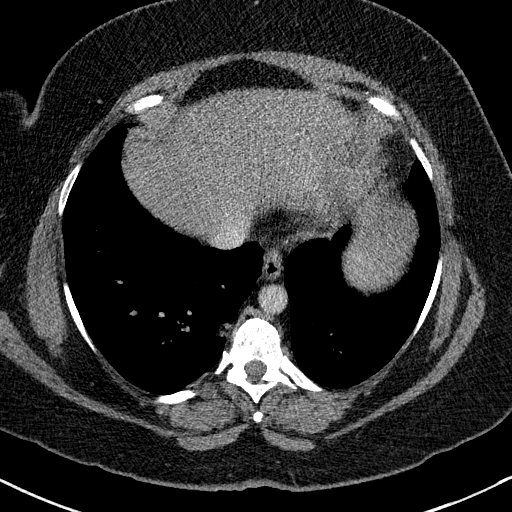
[im 50/134  lung]
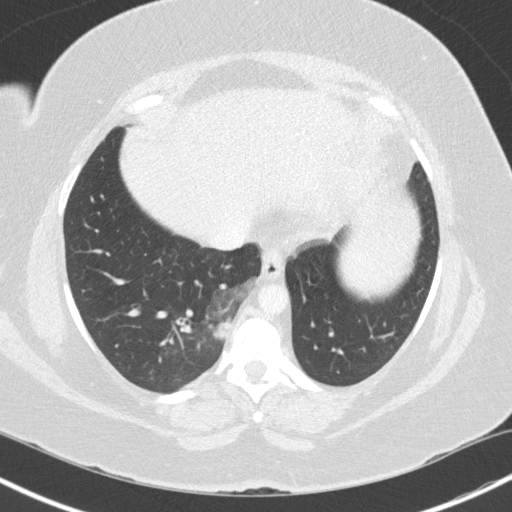
[im 60/134  lung]
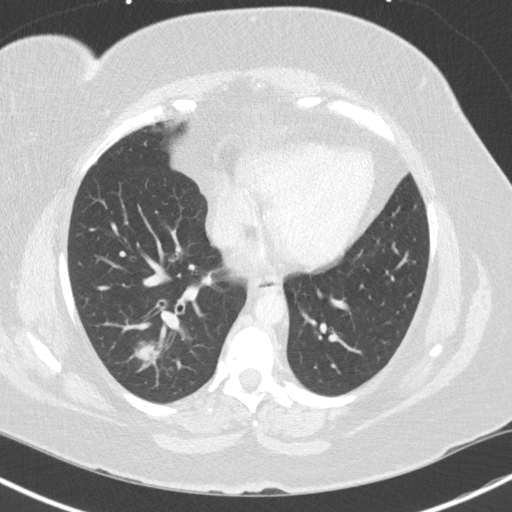
[im 74/134  lung]
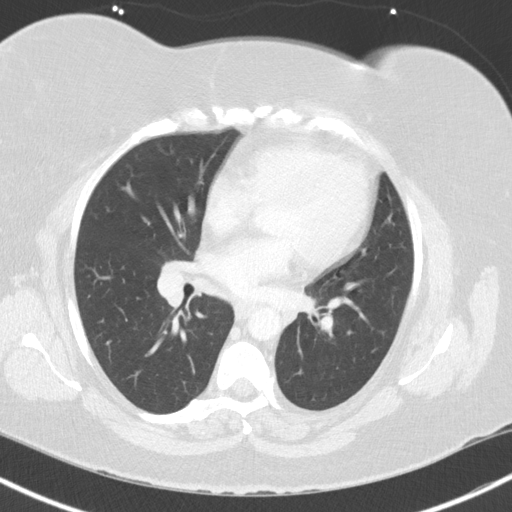
[im 84/134  lung]
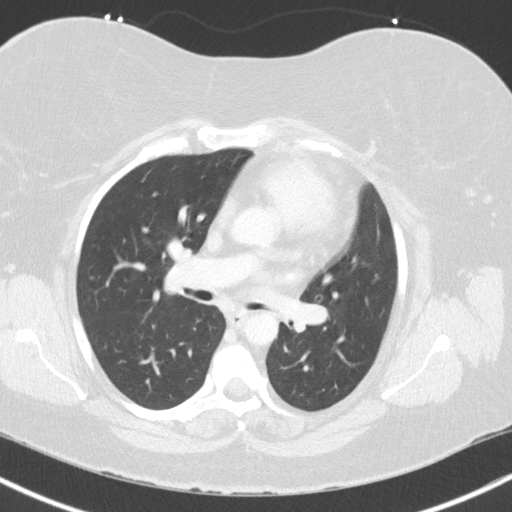
[im 94/134  mediastinal]
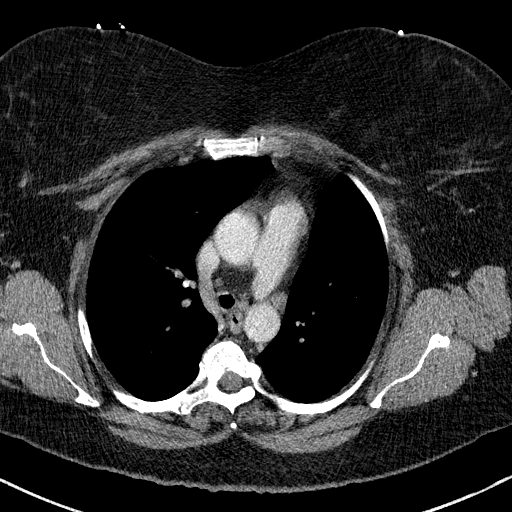
[im 94/134  lung]
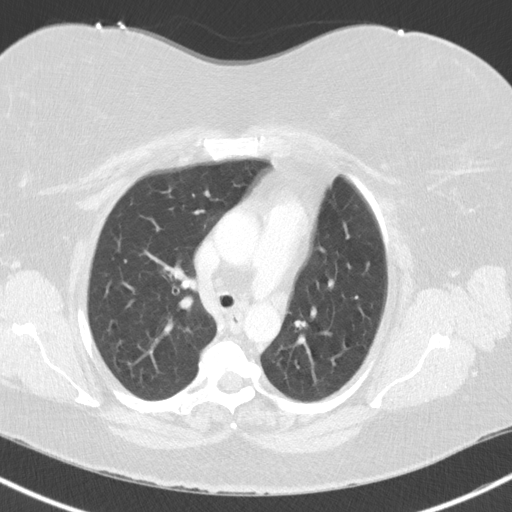
[im 104/134  lung]
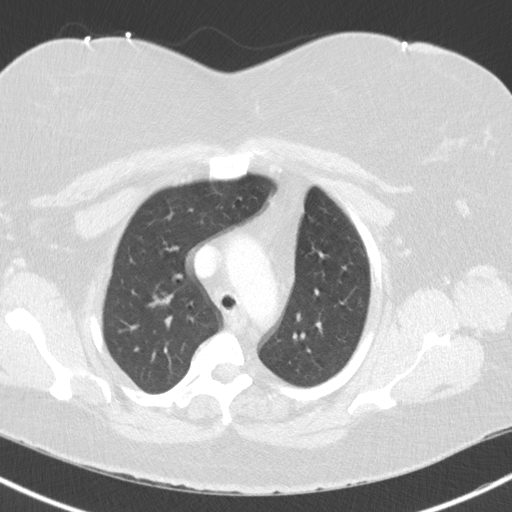
[im 114/134  lung]
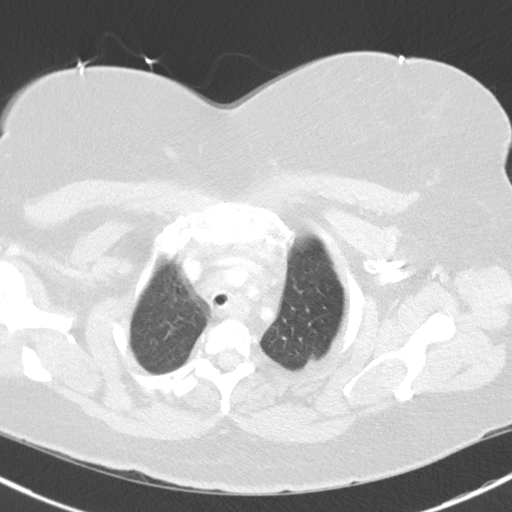
[im 124/134  lung]
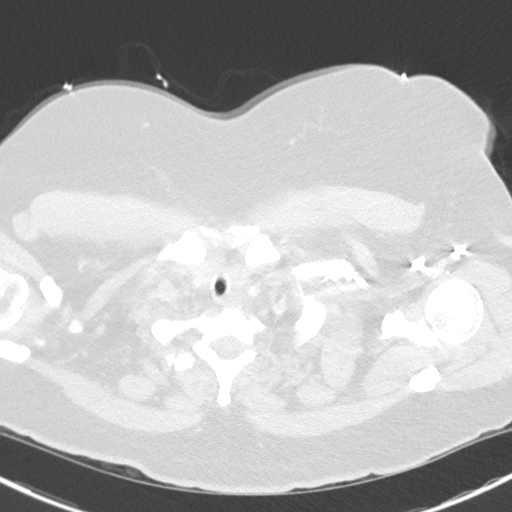

[Series 5: coronal · coronal · 0.54mm/px · 3 of 151 slices shown]
[im 31/151  lung]
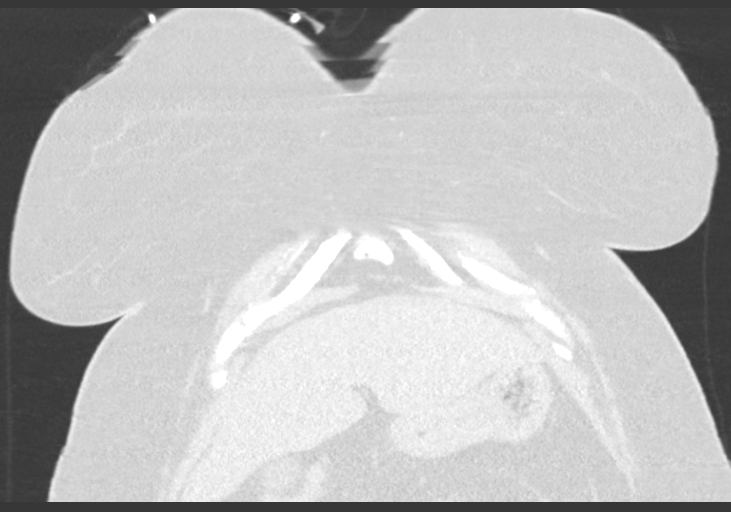
[im 61/151  lung]
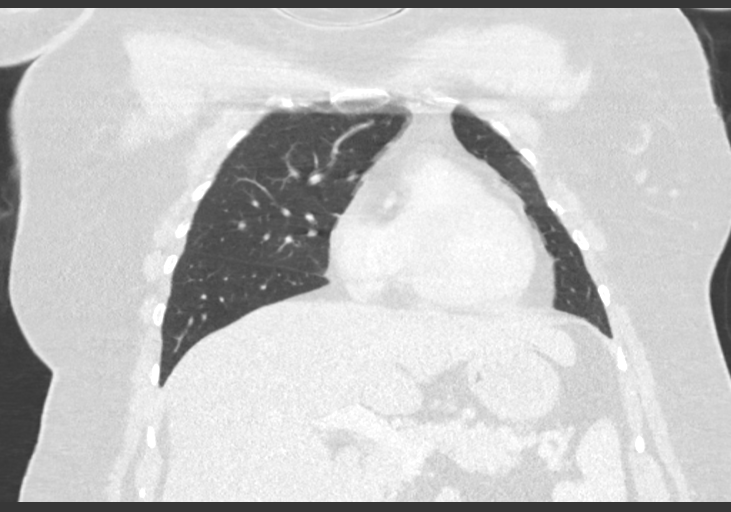
[im 91/151  lung]
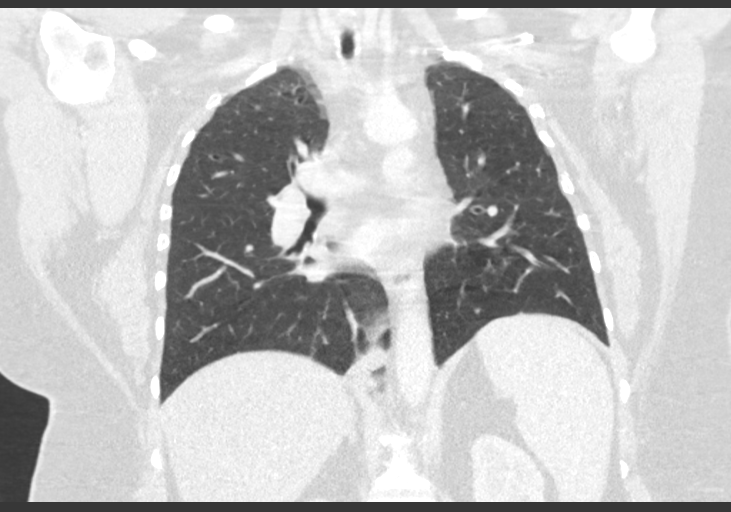

[15 of 36 positions shown; findings below may reference images not displayed]

FINDINGS: Cardiovascular: No significant vascular findings. Normal heart size.
No pericardial effusion.

Mediastinum/Nodes: No enlarged mediastinal, hilar, or axillary lymph
nodes. Thyroid gland, trachea, and esophagus demonstrate no
significant findings.

Lungs/Pleura: No pneumothorax or pleural effusion is noted. Left
lung is clear. Airspace opacity is noted medially and posteriorly in
right lower lobe most consistent with pneumonia or possibly
atelectasis. 1 cm irregular density is noted more superiorly in the
right lower lobe most consistent with focal inflammation, although
neoplasm cannot be excluded..

Upper Abdomen: No acute abnormality.

Musculoskeletal: No chest wall abnormality. No acute or significant
osseous findings.
IMPRESSION: Airspace opacity is noted medially and posteriorly in right lower
lobe most consistent with pneumonia or possibly subsegmental
atelectasis.

1 cm irregular density is noted more superiorly in right lower lobe
which may represent focal inflammation, but neoplasm cannot be
excluded. Follow-up unenhanced chest CT in 6-8 weeks is recommended
to ensure resolution and rule out underlying malignancy.

## 2022-07-17 ENCOUNTER — Encounter: Payer: Self-pay | Admitting: Emergency Medicine

## 2022-07-17 ENCOUNTER — Emergency Department
Admission: EM | Admit: 2022-07-17 | Discharge: 2022-07-17 | Disposition: A | Discharge status: Home / Self Care | Payer: BC Managed Care – PPO | Attending: Emergency Medicine | Admitting: Emergency Medicine

## 2022-07-17 ENCOUNTER — Other Ambulatory Visit: Payer: Self-pay

## 2022-07-17 DIAGNOSIS — L249 Irritant contact dermatitis, unspecified cause: Secondary | ICD-10-CM | POA: Diagnosis not present

## 2022-07-17 DIAGNOSIS — L03114 Cellulitis of left upper limb: Secondary | ICD-10-CM | POA: Diagnosis not present

## 2022-07-17 DIAGNOSIS — R21 Rash and other nonspecific skin eruption: Secondary | ICD-10-CM | POA: Diagnosis present

## 2022-07-17 LAB — CBC
HCT: 46.2 % — ABNORMAL HIGH (ref 36.0–46.0)
Hemoglobin: 14.6 g/dL (ref 12.0–15.0)
MCH: 26.9 pg (ref 26.0–34.0)
MCHC: 31.6 g/dL (ref 30.0–36.0)
MCV: 85.1 fL (ref 80.0–100.0)
Platelets: 201 10*3/uL (ref 150–400)
RBC: 5.43 MIL/uL — ABNORMAL HIGH (ref 3.87–5.11)
RDW: 14.3 % (ref 11.5–15.5)
WBC: 8.3 10*3/uL (ref 4.0–10.5)
nRBC: 0 % (ref 0.0–0.2)

## 2022-07-17 LAB — BASIC METABOLIC PANEL
Anion gap: 10 (ref 5–15)
BUN: 14 mg/dL (ref 6–20)
CO2: 26 mmol/L (ref 22–32)
Calcium: 9.5 mg/dL (ref 8.9–10.3)
Chloride: 104 mmol/L (ref 98–111)
Creatinine, Ser: 0.79 mg/dL (ref 0.44–1.00)
GFR, Estimated: >60 mL/min (ref >60)
Glucose, Bld: 184 mg/dL — ABNORMAL HIGH (ref 70–99)
Potassium: 4 mmol/L (ref 3.5–5.1)
Sodium: 140 mmol/L (ref 135–145)

## 2022-07-17 MED ORDER — PREDNISONE 50 MG PO TABS: ORAL_TABLET | ORAL | 0 refills | Status: DC

## 2022-07-17 MED ORDER — CEPHALEXIN 500 MG PO CAPS: 500.0000 mg | ORAL_CAPSULE | Freq: Four times a day (QID) | ORAL | 0 refills | Status: DC

## 2022-07-17 MED ORDER — PREDNISONE 20 MG PO TABS
60.0000 mg | ORAL_TABLET | Freq: Once | ORAL | Status: AC
  Administered 2022-07-17: 60 mg via ORAL
  Filled 2022-07-17: qty 3

## 2022-07-17 MED ORDER — SULFAMETHOXAZOLE-TRIMETHOPRIM 800-160 MG PO TABS: 1.0000 | ORAL_TABLET | Freq: Two times a day (BID) | ORAL | 0 refills | Status: DC

## 2022-07-17 NOTE — Discharge Instructions (Addendum)
I suspect your rash is due to some type of a irritation possibly something like poison oak or poison ivy.  However also there are some areas that seem rather red and warm to touch, and I cannot exclude a skin infection such as cellulitis.  Please start use of prednisone a steroid to help with inflammation, and also antibiotics.  Monitor the area closely and if it seems like your symptoms or the area of redness is worsening please come back to the ER right away.  I do want you to follow-up with a doctor, preferably your primary doctor within the next 2 days or you may return to the ER for reexam at that time as well.

## 2022-07-17 NOTE — ED Provider Notes (Signed)
[facility] Provider Note    Event Date/Time   First MD Initiated Contact with Patient 07/17/22 1107     (approximate)   History   Rash   HPI  Erin [name] is a 51 y.o. female reports no major past medical history.  She developed over the last week a area of itching and redness started over her left forearm with small bumps and blisters, and is continued on now with some redness upper on the left upper arm as well.  The whole area itches notably.  There are no other areas of the body with rash.  No fevers or chills.  No chest pain no trouble breathing.  She currently reports she takes no medications and has no allergies to anything except for Percocet which she has not exposure to   No known exposure to poison ivy or poison oak, but does have a dog that is outside frequently  Physical Exam   Triage Vital Signs: ED Triage Vitals  Enc Vitals Group     BP 07/17/22 1011 (!) 162/91     Pulse Rate 07/17/22 1011 (!) 118     Resp 07/17/22 1011 20     Temp 07/17/22 1011 98.3 F (36.8 C)     Temp Source 07/17/22 1011 Oral     SpO2 07/17/22 1011 94 %     Weight 07/17/22 1009 250 lb (113.4 kg)     Height 07/17/22 1009 5' (1.524 m)     Head Circumference --      Peak Flow --      Pain Score 07/17/22 1009 7     Pain Loc --      Pain Edu? --      Excl. in GC? --     Most recent vital signs: Vitals:   07/17/22 1011 07/17/22 1227  BP: (!) 162/91 (!) 158/80  Pulse: (!) 118 93  Resp: 20 18  Temp: 98.3 F (36.8 C)   SpO2: 94% 95%     General: Awake, no distress.  Very pleasant seated comfortably.  Daughter at bedside as well earlier.  Pleasant CV:  Good peripheral perfusion.  Normal rate and tones.  Strong left radial pulse. Resp:  Normal effort.  Normal work of breathing no distress Abd:  No distention.  Other:  No oral lesions.  Only area of rashes over her left arm including forearm and upper arm.  Warm well-perfused left hand.  Some very mild  edema overlying the dorsum of the left hand without erythema.  There is erythema overlying the skin primarily over the dorsal aspects and some around the ventral aspect of the left forearm and left upper arm.  Does not extend over the shoulder.  There is a rash that is fairly macular across these areas but some small areas of clear fluid vesicles and occasional slight yellow tinge of the vesicles.  There is no skin breakdown or areas of necrosis.  She reports area to be quite itchy.  Some small areas of crusting.  Small areas of redness itchiness and very small papules.  Some areas of erythema have notable warmth, especially those towards the left upper arm.  No necrotizing process  See clinical media upload  ED Results / Procedures / Treatments   Labs (all labs ordered are listed, but only abnormal results are displayed) Labs Reviewed  CBC - Abnormal; Notable for the following components:      Result Value   RBC 5.43 (*)  HCT 46.2 (*)    All other components within normal limits  BASIC METABOLIC PANEL - Abnormal; Notable for the following components:   Glucose, Bld 184 (*)    All other components within normal limits     EKG     RADIOLOGY     PROCEDURES:  Critical Care performed: No  Procedures   MEDICATIONS ORDERED IN ED: Medications  predniSONE (DELTASONE) tablet 60 mg (60 mg Oral Given 07/17/22 1222)     IMPRESSION / MDM / ASSESSMENT AND PLAN / ED COURSE  I reviewed the triage vital signs and the nursing notes.                              Differential diagnosis includes, but is not limited to, possible contact dermatitis, poison ivy poison oak, drug rash, cellulitis etc.  Based on the clinical history given I suspect she started with some type of a contact dermatitis possibly from a poison oak like reaction but then also has some warmth and erythema and possibly some superinfection or cellulitis associated now as well.  Somewhat hard to delineate the 2.  She is  alert well-appearing nontoxic in appearance.  Heart rate elevated on presentation but had just walked into triage  Patient's presentation is most consistent with acute complicated illness / injury requiring diagnostic workup.    Labs interpreted as reassuring, normal white count afebrile.  Suspect contact dermatitis with possibly some associated cellulitis.  Discussed very careful return precautions with the patient.  Loraine Leriche out the area of erythema, she will monitor at home, return to the ER right away if concerning symptoms extension worsening of rash or other concerns like fever chills weakness etc. arise.  We will follow-up with her doctor in 2 days at [facility] clinic, otherwise may return to the ER for reevaluation  Return precautions and treatment recommendations and follow-up discussed with the patient who is agreeable with the plan.  Vitals:   07/17/22 1011 07/17/22 1227  BP: (!) 162/91 (!) 158/80  Pulse: (!) 118 93  Resp: 20 18  Temp: 98.3 F (36.8 C)   SpO2: 94% 95%         FINAL CLINICAL IMPRESSION(S) / ED DIAGNOSES   Final diagnoses:  Irritant contact dermatitis, unspecified trigger  Cellulitis of left upper extremity     Rx / DC Orders   ED Discharge Orders          Ordered    sulfamethoxazole-trimethoprim (BACTRIM DS) 800-160 MG tablet  2 times daily        07/17/22 1218    cephALEXin (KEFLEX) 500 MG capsule  4 times daily        07/17/22 1218    predniSONE (DELTASONE) 50 MG tablet        07/17/22 1218             Note:  This document was prepared using Dragon voice recognition software and may include unintentional dictation errors.   Sharyn Creamer, MD 07/17/22 1235

## 2022-07-17 NOTE — ED Triage Notes (Signed)
Pt via POV from home. Pt c/o rash, redness, swelling, and blisters to the L arm. States that it started a small spot and it continued to spread. States that she think it is poison ivy or poison oak but has not been in contact with either. States that the blisters began to ooze today. Pt is A&Ox4 and NAD

## 2022-07-17 NOTE — ED Notes (Signed)
See triage note  Presents with redness,swelling and a blistery rash to left arm  States she noticed the small blisters this am  swelling noted to arm and hand   Denies any contact to poison ivy or oak  but states she does have a dog  that may have been in contact

## 2022-11-22 ENCOUNTER — Emergency Department
Admission: EM | Admit: 2022-11-22 | Discharge: 2022-11-22 | Discharge status: Against Medical Advice | Payer: BC Managed Care – PPO | Source: Home / Self Care

## 2022-11-22 ENCOUNTER — Encounter: Payer: Self-pay | Admitting: Emergency Medicine

## 2022-11-22 ENCOUNTER — Ambulatory Visit
Admission: EM | Admit: 2022-11-22 | Discharge: 2022-11-22 | Disposition: A | Discharge status: Other Acute Inpatient Hospital | Payer: BC Managed Care – PPO | Source: Home / Self Care

## 2022-11-22 ENCOUNTER — Ambulatory Visit (INDEPENDENT_AMBULATORY_CARE_PROVIDER_SITE_OTHER): Payer: BC Managed Care – PPO

## 2022-11-22 ENCOUNTER — Other Ambulatory Visit: Payer: Self-pay

## 2022-11-22 ENCOUNTER — Inpatient Hospital Stay
Admission: EM | Admit: 2022-11-22 | Discharge: 2022-11-26 | DRG: 193 | Disposition: A | Discharge status: Home / Self Care | Payer: BC Managed Care – PPO | Source: Ambulatory Visit | Attending: Internal Medicine | Admitting: Internal Medicine

## 2022-11-22 ENCOUNTER — Emergency Department: Payer: BC Managed Care – PPO

## 2022-11-22 DIAGNOSIS — Z833 Family history of diabetes mellitus: Secondary | ICD-10-CM

## 2022-11-22 DIAGNOSIS — Z6841 Body Mass Index (BMI) 40.0 and over, adult: Secondary | ICD-10-CM | POA: Diagnosis not present

## 2022-11-22 DIAGNOSIS — J180 Bronchopneumonia, unspecified organism: Secondary | ICD-10-CM | POA: Diagnosis present

## 2022-11-22 DIAGNOSIS — Z8709 Personal history of other diseases of the respiratory system: Secondary | ICD-10-CM

## 2022-11-22 DIAGNOSIS — Z79899 Other long term (current) drug therapy: Secondary | ICD-10-CM

## 2022-11-22 DIAGNOSIS — Z1152 Encounter for screening for COVID-19: Secondary | ICD-10-CM | POA: Insufficient documentation

## 2022-11-22 DIAGNOSIS — Z9189 Other specified personal risk factors, not elsewhere classified: Secondary | ICD-10-CM

## 2022-11-22 DIAGNOSIS — R0902 Hypoxemia: Secondary | ICD-10-CM | POA: Insufficient documentation

## 2022-11-22 DIAGNOSIS — J4551 Severe persistent asthma with (acute) exacerbation: Secondary | ICD-10-CM

## 2022-11-22 DIAGNOSIS — J123 Human metapneumovirus pneumonia: Secondary | ICD-10-CM | POA: Diagnosis present

## 2022-11-22 DIAGNOSIS — Z885 Allergy status to narcotic agent status: Secondary | ICD-10-CM | POA: Diagnosis not present

## 2022-11-22 DIAGNOSIS — R030 Elevated blood-pressure reading, without diagnosis of hypertension: Secondary | ICD-10-CM

## 2022-11-22 DIAGNOSIS — Z5321 Procedure and treatment not carried out due to patient leaving prior to being seen by health care provider: Secondary | ICD-10-CM | POA: Insufficient documentation

## 2022-11-22 DIAGNOSIS — R0602 Shortness of breath: Secondary | ICD-10-CM

## 2022-11-22 DIAGNOSIS — J9601 Acute respiratory failure with hypoxia: Secondary | ICD-10-CM | POA: Diagnosis present

## 2022-11-22 LAB — CBC
HCT: 43 % (ref 36.0–46.0)
Hemoglobin: 13.6 g/dL (ref 12.0–15.0)
MCH: 26.8 pg (ref 26.0–34.0)
MCHC: 31.6 g/dL (ref 30.0–36.0)
MCV: 84.8 fL (ref 80.0–100.0)
Platelets: 149 10*3/uL — ABNORMAL LOW (ref 150–400)
RBC: 5.07 MIL/uL (ref 3.87–5.11)
RDW: 14.7 % (ref 11.5–15.5)
WBC: 6.9 10*3/uL (ref 4.0–10.5)
nRBC: 0 % (ref 0.0–0.2)

## 2022-11-22 LAB — RESP PANEL BY RT-PCR (FLU A&B, COVID) ARPGX2
Influenza A by PCR: NEGATIVE
Influenza B by PCR: NEGATIVE
SARS Coronavirus 2 by RT PCR: NEGATIVE

## 2022-11-22 LAB — COMPREHENSIVE METABOLIC PANEL
ALT: 38 U/L (ref 0–44)
AST: 48 U/L — ABNORMAL HIGH (ref 15–41)
Albumin: 4.2 g/dL (ref 3.5–5.0)
Alkaline Phosphatase: 98 U/L (ref 38–126)
Anion gap: 10 (ref 5–15)
BUN: 16 mg/dL (ref 6–20)
CO2: 27 mmol/L (ref 22–32)
Calcium: 8.9 mg/dL (ref 8.9–10.3)
Chloride: 100 mmol/L (ref 98–111)
Creatinine, Ser: 0.7 mg/dL (ref 0.44–1.00)
GFR, Estimated: >60 mL/min (ref >60)
Glucose, Bld: 208 mg/dL — ABNORMAL HIGH (ref 70–99)
Potassium: 4.1 mmol/L (ref 3.5–5.1)
Sodium: 137 mmol/L (ref 135–145)
Total Bilirubin: 0.9 mg/dL (ref 0.3–1.2)
Total Protein: 7.9 g/dL (ref 6.5–8.1)

## 2022-11-22 LAB — PHOSPHORUS: Phosphorus: 3.9 mg/dL (ref 2.5–4.6)

## 2022-11-22 LAB — MAGNESIUM: Magnesium: 2.6 mg/dL — ABNORMAL HIGH (ref 1.7–2.4)

## 2022-11-22 LAB — PROCALCITONIN: Procalcitonin: <0.1 ng/mL

## 2022-11-22 LAB — POC URINE PREG, ED: Preg Test, Ur: NEGATIVE

## 2022-11-22 LAB — TROPONIN I (HIGH SENSITIVITY): Troponin I (High Sensitivity): 7 ng/L (ref <18)

## 2022-11-22 MED ORDER — ONDANSETRON HCL 4 MG/2ML IJ SOLN: 4.0000 mg | Freq: Four times a day (QID) | INTRAMUSCULAR | Status: DC | PRN

## 2022-11-22 MED ORDER — ENOXAPARIN SODIUM 60 MG/0.6ML IJ SOSY
0.5000 mg/kg | PREFILLED_SYRINGE | INTRAMUSCULAR | Status: DC
  Administered 2022-11-22 – 2022-11-25 (×4): 60 mg via SUBCUTANEOUS
  Filled 2022-11-22 (×4): qty 0.6

## 2022-11-22 MED ORDER — IOHEXOL 350 MG/ML SOLN
100.0000 mL | Freq: Once | INTRAVENOUS | Status: AC | PRN
  Administered 2022-11-22: 100 mL via INTRAVENOUS

## 2022-11-22 MED ORDER — SODIUM CHLORIDE 0.9 % IV SOLN
500.0000 mg | INTRAVENOUS | Status: DC
  Administered 2022-11-22 – 2022-11-24 (×3): 500 mg via INTRAVENOUS
  Filled 2022-11-22 (×3): qty 5

## 2022-11-22 MED ORDER — IPRATROPIUM-ALBUTEROL 0.5-2.5 (3) MG/3ML IN SOLN
3.0000 mL | Freq: Once | RESPIRATORY_TRACT | Status: AC
  Administered 2022-11-22: 3 mL via RESPIRATORY_TRACT
  Filled 2022-11-22: qty 3

## 2022-11-22 MED ORDER — ENOXAPARIN SODIUM 40 MG/0.4ML IJ SOSY: 40.0000 mg | PREFILLED_SYRINGE | INTRAMUSCULAR | Status: DC

## 2022-11-22 MED ORDER — SODIUM CHLORIDE 0.9 % IV SOLN
1.0000 g | INTRAVENOUS | Status: DC
  Administered 2022-11-22 – 2022-11-24 (×3): 1 g via INTRAVENOUS
  Filled 2022-11-22 (×3): qty 10

## 2022-11-22 MED ORDER — SENNOSIDES-DOCUSATE SODIUM 8.6-50 MG PO TABS: 1.0000 | ORAL_TABLET | Freq: Every evening | ORAL | Status: DC | PRN

## 2022-11-22 MED ORDER — IPRATROPIUM-ALBUTEROL 0.5-2.5 (3) MG/3ML IN SOLN
3.0000 mL | Freq: Four times a day (QID) | RESPIRATORY_TRACT | Status: AC
  Administered 2022-11-22 – 2022-11-23 (×3): 3 mL via RESPIRATORY_TRACT
  Filled 2022-11-22 (×3): qty 3

## 2022-11-22 MED ORDER — METHYLPREDNISOLONE SODIUM SUCC 125 MG IJ SOLR
62.5000 mg | Freq: Once | INTRAMUSCULAR | Status: AC
  Administered 2022-11-22: 62.5 mg via INTRAVENOUS
  Filled 2022-11-22: qty 2

## 2022-11-22 MED ORDER — ACETAMINOPHEN 325 MG PO TABS: 650.0000 mg | ORAL_TABLET | Freq: Four times a day (QID) | ORAL | Status: DC | PRN

## 2022-11-22 MED ORDER — HYDROCOD POLI-CHLORPHE POLI ER 10-8 MG/5ML PO SUER
5.0000 mL | Freq: Every evening | ORAL | Status: DC | PRN
  Administered 2022-11-22 – 2022-11-25 (×4): 5 mL via ORAL
  Filled 2022-11-22 (×4): qty 5

## 2022-11-22 MED ORDER — ONDANSETRON HCL 4 MG PO TABS: 4.0000 mg | ORAL_TABLET | Freq: Four times a day (QID) | ORAL | Status: DC | PRN

## 2022-11-22 MED ORDER — ACETAMINOPHEN 650 MG RE SUPP: 650.0000 mg | Freq: Four times a day (QID) | RECTAL | Status: DC | PRN

## 2022-11-22 MED ORDER — IPRATROPIUM-ALBUTEROL 0.5-2.5 (3) MG/3ML IN SOLN
3.0000 mL | Freq: Once | RESPIRATORY_TRACT | Status: AC
Start: 2022-11-22 — End: 2022-11-22
  Administered 2022-11-22: 3 mL via RESPIRATORY_TRACT

## 2022-11-22 MED ORDER — METHYLPREDNISOLONE SODIUM SUCC 40 MG IJ SOLR
40.0000 mg | Freq: Two times a day (BID) | INTRAMUSCULAR | Status: AC
  Administered 2022-11-23 (×2): 40 mg via INTRAVENOUS
  Filled 2022-11-22 (×2): qty 1

## 2022-11-22 MED ORDER — METHYLPREDNISOLONE SODIUM SUCC 40 MG IJ SOLR
40.0000 mg | Freq: Once | INTRAMUSCULAR | Status: AC
  Administered 2022-11-22: 40 mg via INTRAMUSCULAR

## 2022-11-22 MED ORDER — ALBUTEROL SULFATE (2.5 MG/3ML) 0.083% IN NEBU
2.5000 mg | INHALATION_SOLUTION | Freq: Once | RESPIRATORY_TRACT | Status: AC
  Administered 2022-11-22: 2.5 mg via RESPIRATORY_TRACT

## 2022-11-22 NOTE — Progress Notes (Signed)
Patient seen for cpap therapy. Patient has never worn cpap before but has a relative that has one.  Patient is here for respiratory illness. Attempted cpap with patient. Patient is very cooperative and eager to try however present respiratory illness creates patient from being somewhat successful with therapy at this time. She has a persistent cough. The constant pressure with cough makes it hard for patient to catch her breath. I advised patient while this type of therapy is crucial for those diagnosed with sleep apnea most patients do find it hard using these devices with respiratory illnesses. She has asked to try again tomorrow with the hopes that her cough will be better and she can see if therapy may be beneficial. Patient returned to [facility] at 10L.

## 2022-11-22 NOTE — Discharge Instructions (Signed)
Your covid and flu tests are negative

## 2022-11-22 NOTE — Progress Notes (Signed)
PHARMACIST - PHYSICIAN COMMUNICATION  CONCERNING:  Enoxaparin (Lovenox) for DVT Prophylaxis    RECOMMENDATION: Patient was prescribed enoxaprin 40mg  q24 hours for VTE prophylaxis.   Filed Weights   11/22/22 1300  Weight: 120.2 kg (265 lb)    Body mass index is 51.75 kg/m.  Estimated Creatinine Clearance: 101.3 mL/min (by C-G formula based on SCr of 0.7 mg/dL).   Based on [facility] policy patient is candidate for enoxaparin 0.5mg /kg TBW SQ every 24 hours based on BMI being >30.  DESCRIPTION: Pharmacy has adjusted enoxaparin dose per [facility] policy.  Patient is now receiving enoxaparin 0.5 mg/kg every 24 hours    [facility], PharmD Clinical Pharmacist  11/22/2022 6:33 PM

## 2022-11-22 NOTE — Assessment & Plan Note (Addendum)
-   Procalcitonin has been ordered - Given read of bronchopneumonia on CT imaging, I have ordered azithromycin 500 mg IV, 5 days ordered; ceftriaxone 1 g IV daily, 5 doses ordered to cover for commune acquired pneumonia - DuoNebs scheduled/4 times daily, 3 doses ordered on admission; AM team to continue as appropriate per rounding attending - Solu-Medrol 40 mg IV twice daily ordered for 11/23/2022, 2 doses ordered - Patient is requiring 10 L nasal cannula to maintain SpO2 - Admit to inpatient, progressive cardiac

## 2022-11-22 NOTE — ED Notes (Signed)
Pt pending admission

## 2022-11-22 NOTE — H&P (Addendum)
History and Physical   Erin [name] ZLD:[id number] DOB: [birth year] DOA: 11/22/2022  PCP: [facility] [place]  Patient coming from: Urgent Care center via EMS  I have personally briefly reviewed patient's old medical records in [place].  Chief Concern: Shortness of breath   HPI: Ms. Erin [name] is a 50 year old female with history of asthma, morbid obesity, who presents emergency department for chief concerns of shortness of breath.  Initial vitals in the emergency department temperature 98.1, respiration rate of 32, heart rate of 107, blood pressure 165/83, SpO2 94% on 4 L nasal cannula and currently on 10 L nasal cannula satting at 97%.  Serum sodium is 137, potassium 4.1, chloride of 100, bicarb 27, BUN of 16, serum creatinine of 0.70, EGFR greater than 60, nonfasting blood glucose 208, WBC 6.9, hemoglobin 13.6, platelets of 149.  Urine pregnancy test was negative.  Patient arrived to emergency to demand from urgent care center and at urgent care center, her COVID/influenza A/influenza B PCR test were negative.  ED treatment: DuoNebs one-time treatment, Solu-Medrol 125 mg IV. -------------------------------- At bedside, she is able to tell me her name, her age, current calendar year, and current location. She is using accessory muscles to breath and currently on 10 L [place].  She states she has been short of breath since Friday, 11/18/22. 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  She endorses cough, light green mucus since Friday. She deneis fever, She endroses shortness of breath, wrose with exertion.   She deneis chest pain, nausea, vomting, dysuria, hematuria, diarrhea, swelling of her lower extremities.   She endorses poor PO intake and lost of sleep due to coughing.  Social history:   Social history: She denies tobacco, etoh, recreational drug  use.  ROS: Constitutional: no weight change, no fever ENT/Mouth: no sore throat, no rhinorrhea Eyes: no eye pain, no vision changes Cardiovascular: no chest pain, + dyspnea,  no edema, no palpitations Respiratory: + cough, + sputum, + wheezing Gastrointestinal: no nausea, no vomiting, no diarrhea, no constipation Genitourinary: no urinary incontinence, no dysuria, no hematuria Musculoskeletal: no arthralgias, no myalgias Skin: no skin lesions, no pruritus, Neuro: + weakness, no loss of consciousness, no syncope Psych: no anxiety, no depression, + decrease appetite Heme/Lymph: no bruising, no bleeding  ED Course: Discussed with emergency medicine provider, patient requiring hospitalization for chief concerns of acute hypoxic respiratory failure  Assessment/Plan  Principal Problem:   Bronchopneumonia Active Problems:   Acute respiratory failure with hypoxia ([place])   History of asthma   Morbid obesity with BMI of 50.0-59.9, adult (HCC)   At risk for obstructive sleep apnea   Assessment and Plan:  * Bronchopneumonia - Procalcitonin has been ordered - Given read of bronchopneumonia on CT imaging, I have ordered azithromycin 500 mg IV, 5 days ordered; ceftriaxone 1 g IV daily, 5 doses ordered to cover for commune acquired pneumonia - DuoNebs scheduled/4 times daily, 3 doses ordered on admission; AM team to continue as appropriate per rounding attending - Solu-Medrol 40 mg IV twice daily ordered for 11/23/2022, 2 doses ordered - Patient is requiring 10 L nasal cannula to maintain SpO2 - Admit to inpatient, progressive cardiac  Acute respiratory failure with hypoxia ([place]) - Etiology workup in progress - Presumptive etiology secondary to bronchopneumonia - Urgent care checked COVID/influenza A/influenza B PCR which resulted as negative - Check procalcitonin, 20 pathogen respiratory panel - CPAP  nightly  At risk for obstructive sleep apnea - Patient's BMI is 51.75 and patient  likely has undiagnosed obstructive sleep apnea/obesity hypoventilation syndrome - I ordered CPAP nightly given patient's risk of obstructive sleep apnea and current oxygen requirement of 10 L [place]. I counseled patient that she likely has undiagnosed sleep apnea and can try CPAP tonight given patient's respiratory status.  I also acknowledged that she can refuse if she does not tolerate the CPAP machine. - Patient expresses understanding and willingness to try CPAP tonight  Morbid obesity with BMI of 50.0-59.9, adult ([place]) - This complicates overall care and prognosis.   History of asthma - She denies history of asthma exacerbation requiring hospitalization  Chart reviewed.   DVT prophylaxis: Enoxaparin subcutaneous every 24 hours Code Status: full code Diet: Heart healthy Family Communication: updated daughter and patient's brother at bedside with patient's permission  Disposition Plan: Pending clinical course, patient likely will need more than 2 nights stay Consults called: None at this time Admission status: Progressive cardiac, inpatient  Past Medical History:  Diagnosis Date   Asthma    Past Surgical History:  Procedure Laterality Date   CESAREAN SECTION     NO PAST SURGERIES     Social History:  reports that she has never smoked. She has never used smokeless tobacco. She reports that she does not currently use alcohol. She reports that she does not use drugs.  Allergies  Allergen Reactions   Percocet [Oxycodone-Acetaminophen] Itching    "Under the skin bugs"   Family History  Problem Relation Age of Onset   Diabetes Sister    Diabetes Maternal Grandmother    Family history: Family history reviewed and not pertinent  Prior to Admission medications   Medication Sig Start Date End Date Taking? Authorizing Provider  fexofenadine (ALLEGRA) 180 MG tablet Take 180 mg by mouth daily.    [provider]  Fluticasone-Salmeterol (ADVAIR DISKUS) 250-50 MCG/DOSE AEPB  Inhale 1 puff into the lungs 2 (two) times daily. 04/10/18 04/10/19  Bettey Costa, MD   Physical Exam: Vitals:   11/22/22 1500 11/22/22 1722 11/22/22 1730 11/22/22 1900  BP: 127/78  133/71 (!) 107/57  Pulse: (!) 108  (!) 107 (!) 107  Resp: (!) 21  (!) 24 18  Temp:  98.6 F (37 C)    TempSrc:  Oral    SpO2: 94%  91% 90%  Weight:      Height:       Constitutional: appears age appropriate, NAD, calm, comfortable Eyes: PERRL, lids and conjunctivae normal ENMT: Mucous membranes are moist. Posterior pharynx clear of any exudate or lesions. Age-appropriate dentition. Hearing appropriate Neck: normal, supple, no masses, no thyromegaly Respiratory: clear to auscultation bilaterally, + wheezing, no crackles. Mild increased respiratory effort. + accessory muscle use.  Cardiovascular: Regular rate and rhythm, no murmurs / rubs / gallops. No extremity edema. 2+ pedal pulses. No carotid bruits.  Abdomen: morbidly obese abdomen, no tenderness, no masses palpated, no hepatosplenomegaly. Bowel sounds positive.  Musculoskeletal: no clubbing / cyanosis. No joint deformity upper and lower extremities. Good ROM, no contractures, no atrophy. Normal muscle tone.  Skin: no rashes, lesions, ulcers. No induration Neurologic: Sensation intact. Strength 5/5 in all 4.  Psychiatric: Normal judgment and insight. Alert and oriented x 3. Normal mood.   EKG: independently reviewed, showing sinus tachycardia with rate of 108, qtc 468  Chest x-ray on Admission: I personally reviewed and I agree with radiologist reading as below.  CT Angio Chest PE W  and/or Wo Contrast  Result Date: 11/22/2022 CLINICAL DATA:  Progressively worsening cough and shortness of breath over the past 2-3 days. History of asthma. EXAM: CT ANGIOGRAPHY CHEST WITH CONTRAST TECHNIQUE: Multidetector CT imaging of the chest was performed using the standard protocol during bolus administration of intravenous contrast. Multiplanar CT image reconstructions  and MIPs were obtained to evaluate the vascular anatomy. RADIATION DOSE REDUCTION: This exam was performed according to the departmental dose-optimization program which includes automated exposure control, adjustment of the mA and/or kV according to patient size and/or use of iterative reconstruction technique. CONTRAST:  155m OMNIPAQUE IOHEXOL 350 MG/ML SOLN COMPARISON:  CT chest dated April 09, 2018. FINDINGS: Cardiovascular: Satisfactory opacification of the pulmonary arteries to the segmental level. No evidence of pulmonary embolism. Normal heart size. No pericardial effusion. No thoracic aortic aneurysm or dissection. Mediastinum/Nodes: No enlarged mediastinal, hilar, or axillary lymph nodes. Thyroid gland, trachea, and esophagus demonstrate no significant findings. Lungs/Pleura: Diffuse peribronchial thickening. Patchy irregular centrilobular nodularity in both upper and lower lobes. Partial collapse of the lingula. Subsegmental atelectasis in the right middle lobe. No pleural effusion or pneumothorax. Upper Abdomen: No acute abnormality. Unchanged diffusely decreased liver density. Musculoskeletal: No chest wall abnormality. No acute or significant osseous findings. Unchanged T5-T7 ankylosis. Review of the MIP images confirms the above findings. IMPRESSION: 1. No evidence of pulmonary embolism. 2. Diffuse peribronchial thickening with patchy irregular centrilobular nodularity in both upper and lower lobes, concerning for bronchopneumonia. 3. Partial collapse of the lingula. 4. Unchanged hepatic steatosis. Electronically Signed   By: WTitus DubinM.D.   On: 11/22/2022 17:18   DG Chest 2 View  Result Date: 11/22/2022 CLINICAL DATA:  Shortness of breath.  Low pulse oxygenation. EXAM: CHEST - 2 VIEW COMPARISON:  Chest two views 04/09/2018 and 02/21/2016, CT chest 04/09/2018 FINDINGS: Cardiac silhouette is normal in size. Unchanged prominence of the right hilum from 04/09/2018 and 02/21/2016 radiographs.  This appears to correspond to the normal right pulmonary artery interlobar branch seen on prior CT. The lungs are clear. No pleural effusion or pneumothorax. Moderate anterior T6 vertebral body height loss is unchanged from multiple prior radiographs and 04/09/2018 CT. 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 Mild dextrocurvature of the upper thoracic spine and minimal levocurvature of the mid to lower thoracic spine. IMPRESSION: 1. No active cardiopulmonary disease. 2. Moderate anterior T6 vertebral body height loss is unchanged from multiple prior radiographs and 04/09/2018 CT. 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 Electronically Signed   By: RYvonne KendallM.D.   On: 11/22/2022 11:09    Labs on Admission: I have personally reviewed following labs  CBC: Recent Labs  Lab 11/22/22 1333  WBC 6.9  HGB 13.6  HCT 43.0  MCV 84.8  PLT 1330   Basic Metabolic Panel: Recent Labs  Lab 11/22/22 1555  NA 137  K 4.1  CL 100  CO2 27  GLUCOSE 208*  BUN 16  CREATININE 0.70  CALCIUM 8.9  MG 2.6*  PHOS 3.9   GFR: Estimated Creatinine Clearance: 101.3 mL/min (by C-G formula based on SCr of 0.7 mg/dL).  Liver Function Tests: Recent Labs  Lab 11/22/22 1555  AST 48*  ALT 38  ALKPHOS 98  BILITOT 0.9  PROT 7.9  ALBUMIN 4.2   Urine analysis:    Component Value Date/Time   COLORURINE YELLOW (A) 02/21/2016 1945   APPEARANCEUR HAZY (A) 02/21/2016 1945  LABSPEC 1.023 02/21/2016 1945   PHURINE 5.0 02/21/2016 1945   GLUCOSEU NEGATIVE 02/21/2016 1945   HGBUR 2+ (A) 02/21/2016 1945   BILIRUBINUR NEGATIVE 02/21/2016 1945   KETONESUR TRACE (A) 02/21/2016 1945   PROTEINUR 100 (A) 02/21/2016 1945   NITRITE NEGATIVE 02/21/2016 1945   LEUKOCYTESUR NEGATIVE 02/21/2016 1945   CRITICAL CARE Performed by: Dr. Tobie Poet  Total critical care time: 35 minutes  Critical care time was exclusive of separately billable  procedures and treating other patients.  Critical care was necessary to treat or prevent imminent or life-threatening deterioration.  Critical care was time spent personally by me on the following activities: development of treatment plan with patient and/or surrogate as well as nursing, discussions with consultants, evaluation of patient's response to treatment, examination of patient, obtaining history from patient or surrogate, ordering and performing treatments and interventions, ordering and review of laboratory studies, ordering and review of radiographic studies, pulse oximetry and re-evaluation of patient's condition.  This document was prepared using Dragon Voice Recognition software and may include unintentional dictation errors.  Dr. Tobie Poet [facility]  If 7PM-7AM, please contact overnight-coverage provider If 7AM-7PM, please contact day coverage provider [email or web address]  11/22/2022, 8:02 PM

## 2022-11-22 NOTE — ED Notes (Signed)
EMS called and report given.  

## 2022-11-22 NOTE — ED Triage Notes (Signed)
Per EMS pt coming from [facility] UC c/o breathing difficulty x 4 days. Given duoneb and solumedrol at [facility]. Initial room air sats in 80s.  Given another duoneb and 2mg  mag with EMS. C/o productive cough.

## 2022-11-22 NOTE — Assessment & Plan Note (Addendum)
-   Etiology workup in progress - Presumptive etiology secondary to bronchopneumonia - Urgent care checked COVID/influenza A/influenza B PCR which resulted as negative - Check procalcitonin, 20 pathogen respiratory panel - CPAP nightly

## 2022-11-22 NOTE — Assessment & Plan Note (Signed)
-   She denies history of asthma exacerbation requiring hospitalization

## 2022-11-22 NOTE — ED Provider Notes (Signed)
[facility] Provider Note    Event Date/Time   First MD Initiated Contact with Patient 11/22/22 1546     (approximate)  History   Chief Complaint: Shortness of Breath  HPI  Erin [name] is a 51 y.o. female with a past medical history of asthma who presents to the emergency department for worsening shortness of breath.  According to the patient over the past 2 to 3 days she has had progressive worsening shortness of breath and cough.  Patient went to urgent care this morning where she was found to have a room air saturation in the 70s, had a negative COVID and flu test was transferred to the emergency department for further workup.  Chest x-ray at the urgent care was clear.  On arrival to our emergency department patient continues to be short of breath has rhonchi and wheeze.  Satting in the 80s on 6 L nasal cannula.  Physical Exam   Triage Vital Signs: ED Triage Vitals  Enc Vitals Group     BP 11/22/22 1301 (!) 181/79     Pulse Rate 11/22/22 1301 (!) 115     Resp 11/22/22 1302 (!) 25     Temp 11/22/22 1302 98.9 F (37.2 C)     Temp Source 11/22/22 1302 Oral     SpO2 11/22/22 1258 97 %     Weight 11/22/22 1300 265 lb (120.2 kg)     Height 11/22/22 1300 5' (1.524 m)     Head Circumference --      Peak Flow --      Pain Score 11/22/22 1300 0     Pain Loc --      Pain Edu? --      Excl. in GC? --     Most recent vital signs: Vitals:   11/22/22 1400 11/22/22 1430  BP: (!) 140/68 (!) 154/47  Pulse: (!) 105 (!) 116  Resp: 20 (!) 22  Temp:    SpO2: 97% 95%    General: Awake, no distress.  CV:  Regular rhythm rate around 120 bpm (patient received several albuterol treatments). Resp:  Mild tachypnea but moderate wheeze bilaterally. Abd:  No distention.  Soft, nontender.  No rebound or guarding.   ED Results / Procedures / Treatments   EKG  EKG viewed and interpreted by myself shows sinus tachycardia 108 bpm with a narrow QRS, normal axis,  normal intervals, nonspecific but no concerning ST changes.  RADIOLOGY  Chest x-ray performed in urgent care read as negative for acute abnormality.   MEDICATIONS ORDERED IN ED: Medications  methylPREDNISolone sodium succinate (SOLU-MEDROL) 125 mg/2 mL injection 62.5 mg (62.5 mg Intravenous Given 11/22/22 1342)  ipratropium-albuterol (DUONEB) 0.5-2.5 (3) MG/3ML nebulizer solution 3 mL (3 mLs Nebulization Given 11/22/22 1345)  ipratropium-albuterol (DUONEB) 0.5-2.5 (3) MG/3ML nebulizer solution 3 mL (3 mLs Nebulization Given 11/22/22 1345)     IMPRESSION / MDM / ASSESSMENT AND PLAN / ED COURSE  I reviewed the triage vital signs and the nursing notes.  Patient's presentation is most consistent with acute presentation with potential threat to life or bodily function.  Patient presents to the emergency department for worsening shortness of breath hypoxia.  Patient has a history of asthma has diffuse wheeze on exam patient given 62.5 mg of IV Solu-Medrol as the patient received an IM injection of steroids prior to transfer to the emergency department.  We will also dose an additional 2 DuoNebs.  Given the patient's fairly profound hypoxia we  will obtain CTA imaging of the chest given a negative chest x-ray at the urgent care.  COVID and flu have been negative as well at the urgent care.  Patient will require admission to the hospital once emergency department workup was been completed.  They have been experiencing difficulty getting a chemistry on the patient for renal function prior to CTA imaging.  Once a chemistry is obtained we will obtain CT imaging to rule out pulmonary embolism.  Placed the patient on high flow minified nasal cannula.  She is tolerating this very well.  Satting in the mid 90s on 10 L.  Reassuringly patient's COVID and flu test at the urgent care was negative.  Patient remains afebrile.  FINAL CLINICAL IMPRESSION(S) / ED DIAGNOSES   Dyspnea Hypoxia Asthma  exacerbation  Note:  This document was prepared using Dragon voice recognition software and may include unintentional dictation errors.   Minna Antis, MD 11/22/22 [phone]

## 2022-11-22 NOTE — Assessment & Plan Note (Signed)
-   This complicates overall care and prognosis.  

## 2022-11-22 NOTE — ED Notes (Signed)
Provider notified of abnormal vitals. Neb treatment ordered and administered. Post neb treatment patient 02 status 90-91%.

## 2022-11-22 NOTE — ED Notes (Signed)
Patient is being discharged from the Urgent Care and sent to the Emergency Department via EMS . Per Nettie Elm, [place], patient is in need of higher level of care due to asthma exacerbation. Patient is aware and verbalizes understanding of plan of care.  Vitals:   11/22/22 1037 11/22/22 1039  BP:    Pulse:  (!) 107  Resp:    Temp:    SpO2: 92% 94%

## 2022-11-22 NOTE — Hospital Course (Signed)
Ms. Erin [name] is a 51 year old female with history of asthma, morbid obesity, who presents emergency department for chief concerns of shortness of breath. She is requiring 10 L oxygen for significant hypoxemia.  Positive for human metapneumovirus. CT angiogram chest did not show PE, showed diffuse peribronchial thickening with patchy irregular centrilobular nodularity in both upper and lower lobes, concerning for bronchopneumonia.  Patient was placed on Rocephin and Zithromax, also placed on Solu-Medrol.

## 2022-11-22 NOTE — ED Triage Notes (Signed)
Patient presents to UC for SOB, cough, loss of appetite since Saturday. States she has hx of asthma and needs albuterol refill. Treating symptoms with nyquil, mucinex. Was in the ED yesterday left did not want to wait.

## 2022-11-22 NOTE — ED Provider Notes (Signed)
Care assumed of patient from outgoing provider.  See their note for initial history, exam and plan.  Clinical Course as of 11/22/22 1737  Tue Nov 22, 2022  1546 Acute hypoxia with 72% on RA.  No prior O2 use. No tobacco use.  Asthma history, given duonebs, HFNC at 10L, solumedrol. Covid/flu neg.  CTA and admit.  [SM]    Clinical Course User Index [SM] Corena Herter, MD   CT without signs of pulmonary embolism.  Peribronchial thickening.  No focal findings consistent with a focal bacterial pneumonia.  Consulted hospitalist for admission for acute hypoxic respiratory failure in the setting of an asthma exacerbation   Corena Herter, MD 11/22/22 [phone]

## 2022-11-22 NOTE — ED Notes (Addendum)
Report received from [facility]. Patient care assumed. Patient/RN introduction complete. Will continue to monitor. Pt back from CT, no distress noted at this time.

## 2022-11-22 NOTE — ED Provider Notes (Addendum)
[facility]    CSN: [id number] Arrival date & time: 11/22/22  1008      History   Chief Complaint Chief Complaint  Patient presents with   Cough   Shortness of Breath    HPI Erin [name] is a 51 y.o. female who presents with onset of Cough 4 days ago which gradually has been getting worse. She ran out of her Albuterol inhaler and has been SOB and wheezing. She went to ER last night, but left without being seen. Has hx of asthma. Does not thinks anything triggered it, that she knows of. 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 Denies fever, chills sweats, or body aches.  Has not been using her daily preventive asthma med for a few years.    Past Medical History:  Diagnosis Date   Asthma     Patient Active Problem List   Diagnosis Date Noted   Acute respiratory failure with hypoxia ([place]) 04/09/2018   Acute respiratory failure ([place]) 04/09/2018   Disequilibrium 02/24/2016   Syncope and collapse 02/24/2016    Past Surgical History:  Procedure Laterality Date   CESAREAN SECTION     NO PAST SURGERIES      OB History   No obstetric history on file.      Home Medications    Prior to Admission medications   Medication Sig Start Date End Date Taking? Authorizing Provider  fexofenadine (ALLEGRA) 180 MG tablet Take 180 mg by mouth daily.    [provider]  Fluticasone-Salmeterol (ADVAIR DISKUS) 250-50 MCG/DOSE AEPB Inhale 1 puff into the lungs 2 (two) times daily. 04/10/18 04/10/19  Bettey Costa, MD    Family History Family History  Problem Relation Age of Onset   Diabetes Sister     Social History Social History   Tobacco Use   Smoking status: Never   Smokeless tobacco: Never  Substance Use Topics   Alcohol use: Not Currently   Drug use: Never     Allergies   Percocet [oxycodone-acetaminophen]   Review of Systems Review of Systems  Constitutional:  Positive for fatigue. Negative for chills and fever.   HENT:  Negative for congestion, ear discharge, ear pain and rhinorrhea.   Eyes:  Negative for discharge.  Respiratory:  Positive for cough, chest tightness, shortness of breath and wheezing.   Musculoskeletal:  Negative for myalgias.  Neurological:  Negative for headaches.     Physical Exam Triage Vital Signs ED Triage Vitals  Enc Vitals Group     BP 11/22/22 1016 (!) 165/83     Pulse Rate 11/22/22 1016 (!) 106     Resp 11/22/22 1016 (!) 32     Temp 11/22/22 1016 99.1 F (37.3 C)     Temp Source 11/22/22 1016 Temporal     SpO2 11/22/22 1016 (S) (!) 88 %     Weight --      Height --      Head Circumference --      Peak Flow --      Pain Score 11/22/22 1027 0     Pain Loc --      Pain Edu? --      Excl. in [place]? --    No data found.  Updated Vital Signs BP (!) 165/83 (BP Location: Left Arm)   Pulse (!) 107   Temp 99.1 F (37.3 C) (Temporal)   Resp (!) 32   SpO2 94%   Visual Acuity Right  Eye Distance:   Left Eye Distance:   Bilateral Distance:    Right Eye Near:   Left Eye Near:    Bilateral Near:      Physical Exam Constitutional:      General: She is not toxic-appearing, but is SOB.  HENT:     Head: Normocephalic.     Right Ear: Tympanic membrane, ear canal and external ear normal.     Left Ear: Ear canal and external ear normal.     Nose: Nose normal.     Mouth/Throat:     Mouth: Mucous membranes are moist.     Pharynx: Oropharynx is clear.  Eyes:     General: No scleral icterus.    Conjunctiva/sclera: Conjunctivae normal.  Cardiovascular:     Rate and Rhythm: Normal rate and regular rhythm.     Heart sounds: No murmur heard.   Pulmonary:     Effort: Pulmonary effort is abnormal. No respiratory distress.     Breath sounds: tight Wheezing present all over lung fields and deep breaths provoked her to cough.     Comments: Has auditory wheezing. Wheezing was slightly improved after second neb treatment, but pulse ox stayed at 91% without  O2 Musculoskeletal:        General: Normal range of motion.     Cervical back: Neck supple.  Lymphadenopathy:     Cervical: No cervical adenopathy.  Skin:    General: Skin is warm and dry.     Findings: No rash.  Neurological:     Mental Status: He is alert and oriented to person, place, and time.     Gait: Gait normal.  Psychiatric:        Mood and Affect: Mood normal.        Behavior: Behavior normal.        Thought Content: Thought content normal.        Judgment: Judgment normal.    UC Treatments / Results  Labs (all labs ordered are listed, but only abnormal results are displayed) Labs Reviewed  RESP PANEL BY RT-PCR (FLU A&B, COVID) [id number]  Covid and Flu test are negative.   EKG   Radiology DG Chest 2 View  Result Date: 11/22/2022 CLINICAL DATA:  Shortness of breath.  Low pulse oxygenation. EXAM: CHEST - 2 VIEW COMPARISON:  Chest two views 04/09/2018 and 02/21/2016, CT chest 04/09/2018 FINDINGS: Cardiac silhouette is normal in size. Unchanged prominence of the right hilum from 04/09/2018 and 02/21/2016 radiographs. This appears to correspond to the normal right pulmonary artery interlobar branch seen on prior CT. The lungs are clear. No pleural effusion or pneumothorax. Moderate anterior T6 vertebral body height loss is unchanged from multiple prior radiographs and 04/09/2018 CT. 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 Mild dextrocurvature of the upper thoracic spine and minimal levocurvature of the mid to lower thoracic spine. IMPRESSION: 1. No active cardiopulmonary disease. 2. Moderate anterior T6 vertebral body height loss is unchanged from multiple prior radiographs and 04/09/2018 CT. 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 Electronically Signed   By: Yvonne Kendall M.D.   On: 11/22/2022 11:09    Procedures Procedures (including critical care time)  Medications Ordered in UC Medications   ipratropium-albuterol (DUONEB) 0.5-2.5 (3) MG/3ML nebulizer solution 3 mL (3 mLs Nebulization Given 11/22/22 1021)  methylPREDNISolone sodium succinate (SOLU-MEDROL) 40 mg/mL injection 40 mg (40 mg Intramuscular Given 11/22/22 1034)  albuterol (PROVENTIL) (2.5 MG/3ML) 0.083% nebulizer solution 2.5 mg (2.5 mg Nebulization Given 11/22/22 1102)    Initial Impression / Assessment and Plan / UC Course  I have reviewed the triage vital signs and the nursing notes.  Pertinent labs & imaging results that were available during my care of the patient were reviewed by me and considered in my medical decision making (see chart for details).  She was given duo neb up on arrival which helped her pulse increase to 91%. She was given Solumedrol IM 40 mg, and placed on 2L oxygen which kept her pulse ox at 92%. So the O2 was increased to 4L. She was given Albuterol Neb 30 minutes after the steroid shot. Pulse ox improved 94%, but after sitting for a few minutes it droped to 91% again. She was placed back on O2 4L After second neb pt stayed at 97% for 3 minutes, then desaturated to 89%, and with deep breaths and after coughing pulse ox would only stay at 91%. She was placed back on O2 at 4L IV was started EMS was called and taken to ER Final Clinical Impressions(s) / UC Diagnoses   Final diagnoses:  Severe persistent asthma with acute exacerbation  Hypoxemia  Elevated blood pressure reading     Discharge Instructions      Your covid and flu tests are negative       ED Prescriptions   None    PDMP not reviewed this encounter.   Garey Ham, PA-C 11/22/22 1158    Rodriguez[place], [place], PA-C 11/22/22 1216

## 2022-11-22 NOTE — Assessment & Plan Note (Addendum)
-   Patient's BMI is 51.75 and patient likely has undiagnosed obstructive sleep apnea/obesity hypoventilation syndrome - I ordered CPAP nightly given patient's risk of obstructive sleep apnea and current oxygen requirement of 10 L [place]. I counseled patient that she likely has undiagnosed sleep apnea and can try CPAP tonight given patient's respiratory status.  I also acknowledged that she can refuse if she does not tolerate the CPAP machine. - Patient expresses understanding and willingness to try CPAP tonight

## 2022-11-23 DIAGNOSIS — Z6841 Body Mass Index (BMI) 40.0 and over, adult: Secondary | ICD-10-CM

## 2022-11-23 DIAGNOSIS — J9601 Acute respiratory failure with hypoxia: Secondary | ICD-10-CM

## 2022-11-23 LAB — RESPIRATORY PANEL BY PCR

## 2022-11-23 LAB — BASIC METABOLIC PANEL
Anion gap: 7 (ref 5–15)
BUN: 14 mg/dL (ref 6–20)
CO2: 30 mmol/L (ref 22–32)
Calcium: 9.1 mg/dL (ref 8.9–10.3)
Chloride: 104 mmol/L (ref 98–111)
Creatinine, Ser: 0.59 mg/dL (ref 0.44–1.00)
GFR, Estimated: >60 mL/min (ref >60)
Glucose, Bld: 162 mg/dL — ABNORMAL HIGH (ref 70–99)
Potassium: 4.9 mmol/L (ref 3.5–5.1)
Sodium: 141 mmol/L (ref 135–145)

## 2022-11-23 LAB — CBC
HCT: 45.7 % (ref 36.0–46.0)
Hemoglobin: 14.2 g/dL (ref 12.0–15.0)
MCH: 27 pg (ref 26.0–34.0)
MCHC: 31.1 g/dL (ref 30.0–36.0)
MCV: 87 fL (ref 80.0–100.0)
Platelets: 171 10*3/uL (ref 150–400)
RBC: 5.25 MIL/uL — ABNORMAL HIGH (ref 3.87–5.11)
RDW: 14.6 % (ref 11.5–15.5)
WBC: 6.5 10*3/uL (ref 4.0–10.5)
nRBC: 0 % (ref 0.0–0.2)

## 2022-11-23 LAB — POC URINE PREG, ED: Preg Test, Ur: NEGATIVE

## 2022-11-23 MED ORDER — IPRATROPIUM-ALBUTEROL 0.5-2.5 (3) MG/3ML IN SOLN
3.0000 mL | Freq: Four times a day (QID) | RESPIRATORY_TRACT | Status: DC
  Administered 2022-11-23 – 2022-11-24 (×4): 3 mL via RESPIRATORY_TRACT
  Filled 2022-11-23 (×3): qty 3

## 2022-11-23 NOTE — ED Notes (Signed)
Pt placed on hospital bed

## 2022-11-23 NOTE — Progress Notes (Signed)
  Progress Note   Patient: Erin [name] MWN:[id number] DOB: 1970-12-21 DOA: 11/22/2022     1 DOS: the patient was seen and examined on 11/23/2022   Brief hospital course: Ms. Edla Para is a 51 year old female with history of asthma, morbid obesity, who presents emergency department for chief concerns of shortness of breath. She is requiring 10 L oxygen for significant hypoxemia.  Positive for human metapneumovirus. CT angiogram chest did not show PE, showed diffuse peribronchial thickening with patchy irregular centrilobular nodularity in both upper and lower lobes, concerning for bronchopneumonia.  Patient was placed on Rocephin and Zithromax, also placed on Solu-Medrol.  Assessment and Plan: * Bronchopneumonia secondary to metapneumovirus infection. Acute respiratory failure with hypoxia (HCC) History of asthma Patient still has very high oxygen requirement, short of breath with exertion.  Will continue current treatment with antibiotic and steroids. Continue bronchodilator.  At risk for obstructive sleep apnea Morbid obesity with BMI of 50.0-59.9, adult (HCC) - This complicates overall care and prognosis       Subjective:  She has short of breath with motion with some wheezes.  On 7 L oxygen  Physical Exam: Vitals:   11/23/22 0824 11/23/22 0900 11/23/22 1100 11/23/22 1312  BP:  134/65 134/85   Pulse:  91 82   Resp:  (!) 21 (!) 21   Temp: 98.2 F (36.8 C)   98 F (36.7 C)  TempSrc:    Oral  SpO2:  93% 91%   Weight:      Height:       General exam: Appears calm and comfortable.  Morbid obese. Respiratory system: Mild wheezes.Marland Kitchen Respiratory effort normal. Cardiovascular system: S1 & S2 heard, RRR. No JVD, murmurs, rubs, gallops or clicks. No pedal edema. Gastrointestinal system: Abdomen is nondistended, soft and nontender. No organomegaly or masses felt. Normal bowel sounds heard. Central nervous system: Alert and oriented. No focal neurological  deficits. Extremities: Symmetric 5 x 5 power. Skin: No rashes, lesions or ulcers Psychiatry: Judgement and insight appear normal. Mood & affect appropriate. ' Data Reviewed:  Reviewed chest CT scan results, images.  Reviewed lab results.  Family Communication: Sister updated at bedside.  Disposition: Status is: Inpatient Remains inpatient appropriate because: Guarded disease, IV treatment, severe hypoxemia.  Planned Discharge Destination: Home    Time spent: 50 minutes  Author: Marrion Coy, MD 11/23/2022 2:34 PM  For on call review [email or web address].

## 2022-11-24 DIAGNOSIS — J9601 Acute respiratory failure with hypoxia: Secondary | ICD-10-CM | POA: Diagnosis present

## 2022-11-24 DIAGNOSIS — J123 Human metapneumovirus pneumonia: Secondary | ICD-10-CM | POA: Diagnosis present

## 2022-11-24 MED ORDER — IPRATROPIUM-ALBUTEROL 0.5-2.5 (3) MG/3ML IN SOLN
3.0000 mL | Freq: Four times a day (QID) | RESPIRATORY_TRACT | Status: DC | PRN
  Administered 2022-11-25: 3 mL via RESPIRATORY_TRACT

## 2022-11-24 MED ORDER — IPRATROPIUM-ALBUTEROL 0.5-2.5 (3) MG/3ML IN SOLN: 3.0000 mL | Freq: Two times a day (BID) | RESPIRATORY_TRACT | Status: DC

## 2022-11-24 MED ORDER — IPRATROPIUM-ALBUTEROL 0.5-2.5 (3) MG/3ML IN SOLN
3.0000 mL | Freq: Two times a day (BID) | RESPIRATORY_TRACT | Status: DC
  Administered 2022-11-24 – 2022-11-25 (×2): 3 mL via RESPIRATORY_TRACT
  Filled 2022-11-24 (×2): qty 3

## 2022-11-24 NOTE — ED Notes (Incomplete)
Checked on pt. She had a question about her medications and if she was still getting antibiotics. I checked her MAR and told her that she has them scheduled for tonight.

## 2022-11-24 NOTE — ED Notes (Signed)
Messaged MD about 0500 labs, there were no orders and nothing was drawn. Awaiting response.

## 2022-11-24 NOTE — ED Notes (Signed)
Family member at bedside.

## 2022-11-24 NOTE — ED Notes (Signed)
Pt. Up at bedside, conversational, alert and oriented. Pt. Denies pain currently. Pt. Provided incentive spirometer, and  instructed on use. Pt. Set up for breakfast. Pulse ox switched to ear so pt. Can use her hands.

## 2022-11-24 NOTE — Progress Notes (Signed)
  Progress Note   Patient: Erin [name] [id number] DOB: 01/31/1971 DOA: 11/22/2022     2 DOS: the patient was seen and examined on 11/24/2022   Brief hospital course: Ms. Acacia Latorre is a 51 year old female with history of asthma, morbid obesity, who presents emergency department for chief concerns of shortness of breath. She is requiring 10 L oxygen for significant hypoxemia.  Positive for human metapneumovirus. CT angiogram chest did not show PE, showed diffuse peribronchial thickening with patchy irregular centrilobular nodularity in both upper and lower lobes, concerning for bronchopneumonia.  Patient was placed on Rocephin and Zithromax, also placed on Solu-Medrol.  Assessment and Plan: * Bronchopneumonia secondary to metapneumovirus infection. Acute respiratory failure with hypoxia (HCC) History of asthma Patient feels better today, was able to wean down oxygen to 8 L.  She no longer has significant bronchospasm.  Will continue current treatment with antibiotics, steroids and bronchodilator.  Also added incentive spirometer. Patient condition seem to be improving, patient will be admitted to MedSurg unit instead of progressive unit which does not have bed available.   At risk for obstructive sleep apnea Morbid obesity with BMI of 50.0-59.9, adult (HCC) Diet and exercise advised.      Subjective:  Patient slept well last night without needing BiPAP.  She feels much better today, however, she still 8 L oxygen, was on 10 L last night. No fever or chills.  Physical Exam: Vitals:   11/24/22 1100 11/24/22 1115 11/24/22 1130 11/24/22 1145  BP:      Pulse: 98 92 89 91  Resp: 16 15 (!) 23 (!) 21  Temp:      TempSrc:      SpO2: 94% 93% 94% 93%  Weight:      Height:       General exam: Appears calm and comfortable  Respiratory system: Clear to auscultation. Respiratory effort normal. Cardiovascular system: S1 & S2 heard, RRR. No JVD, murmurs, rubs, gallops or  clicks. No pedal edema. Gastrointestinal system: Abdomen is nondistended, soft and nontender. No organomegaly or masses felt. Normal bowel sounds heard. Central nervous system: Alert and oriented. No focal neurological deficits. Extremities: Symmetric 5 x 5 power. Skin: No rashes, lesions or ulcers Psychiatry: Judgement and insight appear normal. Mood & affect appropriate.   Data Reviewed:  Lab results reviewed.  Family Communication: None  Disposition: Status is: Inpatient Remains inpatient appropriate because: Severity of disease, IV treatment.  Planned Discharge Destination: Home    Time spent: 55 minutes  Author: Marrion Coy, MD 11/24/2022 1:16 PM  For on call review [email or web address].

## 2022-11-24 NOTE — ED Notes (Addendum)
Dr. Chipper Herb notified that pt. Is now on 2L  and satting 93/94%. Dr. Chipper Herb states plans for d/c tomorrow.

## 2022-11-25 DIAGNOSIS — J123 Human metapneumovirus pneumonia: Secondary | ICD-10-CM

## 2022-11-25 DIAGNOSIS — J9601 Acute respiratory failure with hypoxia: Secondary | ICD-10-CM | POA: Diagnosis not present

## 2022-11-25 DIAGNOSIS — Z6841 Body Mass Index (BMI) 40.0 and over, adult: Secondary | ICD-10-CM | POA: Diagnosis not present

## 2022-11-25 MED ORDER — AMOXICILLIN-POT CLAVULANATE 875-125 MG PO TABS: 1.0000 | ORAL_TABLET | Freq: Two times a day (BID) | ORAL | Status: DC

## 2022-11-25 MED ORDER — AMOXICILLIN-POT CLAVULANATE 875-125 MG PO TABS
1.0000 | ORAL_TABLET | Freq: Two times a day (BID) | ORAL | Status: DC
  Administered 2022-11-25 – 2022-11-26 (×2): 1 via ORAL
  Filled 2022-11-25 (×2): qty 1

## 2022-11-25 MED ORDER — IPRATROPIUM-ALBUTEROL 0.5-2.5 (3) MG/3ML IN SOLN
3.0000 mL | Freq: Four times a day (QID) | RESPIRATORY_TRACT | Status: DC
  Administered 2022-11-25 – 2022-11-26 (×4): 3 mL via RESPIRATORY_TRACT
  Filled 2022-11-25 (×6): qty 3

## 2022-11-25 NOTE — Progress Notes (Signed)
   11/25/22 0948  TOC Assessment  TOC screening is complete Yes  Expected Discharge Plan Home/Self Care  Final next level of care Home/Self Care  Barriers to Discharge No Barriers Identified  Living arrangements for the past 2 months Single Family Home  Lives with: Adult Children;Relatives (Brother and Daughter)  Discharge Planning Services CM Consult  Do you feel safe going back to the place where you live? Y  Patient language and need for interpreter reviewed: Yes  Criminal Activity/Legal Involvement Pertinent to Current Situation/Hospitalization No - Comment as needed  Need for Family Participation in Patient Care N  Care giver support system in place? Y  Appearance: Appears stated age  Attitude/Demeanor/Rapport Engaged  Affect (typically observed) Pleasant  Orientation:  Oriented to Self;Oriented to Place;Oriented to  Time;Oriented to Situation  Alcohol / Substance Use Not Applicable  Psych Involvement N   Spoke with the patient, She lives at home with her daughter and brother, they provide transportation if needed She does not use Oxygen at home Per MD will wait one more day to determine need No other needs identified

## 2022-11-25 NOTE — Plan of Care (Signed)

## 2022-11-25 NOTE — Progress Notes (Signed)
  Progress Note   Patient: Erin [name] [id number] DOB: November 11, 1971 DOA: 11/22/2022     3 DOS: the patient was seen and examined on 11/25/2022   Brief hospital course: Ms. Erin [name] is a 51 year old female with history of asthma, morbid obesity, who presents emergency department for chief concerns of shortness of breath. She is requiring 10 L oxygen for significant hypoxemia.  Positive for human metapneumovirus. CT angiogram chest did not show PE, showed diffuse peribronchial thickening with patchy irregular centrilobular nodularity in both upper and lower lobes, concerning for bronchopneumonia.  Patient was placed on Rocephin and Zithromax, also placed on Solu-Medrol.  Assessment and Plan: * Bronchopneumonia secondary to metapneumovirus infection. Acute respiratory failure with hypoxia (HCC) History of asthma Patient much improved today, oxygenation much better, was able to wean down to 2 L oxygen, but she still desats with mobility.  She still Cough with large amount of mucus and bronchospasm.  I will keep patient for another day, most likely will be discharged home tomorrow without oxygen. Continue bronchodilator, incentive spirometer and flutter valve. Antibiotics switched to Augmentin for additional 2 days.   At risk for obstructive sleep apnea Morbid obesity with BMI of 50.0-59.9, adult (HCC) Diet and exercise advised.      Subjective:  Still has not short of breath with exertion, cough, multiple mucus.  Physical Exam: Vitals:   11/24/22 1640 11/24/22 2023 11/24/22 2356 11/25/22 0805  BP: (!) 155/88  (!) 145/78 132/65  Pulse: 94  74 75  Resp: 18  20 16   Temp: 98.9 F (37.2 C)  98.7 F (37.1 C) 98.4 F (36.9 C)  TempSrc:      SpO2: 98% 99% 100% 100%  Weight:      Height:       General exam: Appears calm and comfortable, morbidly obese. Respiratory system: Mild wheezes. Respiratory effort normal. Cardiovascular system: S1 & S2 heard, RRR. No JVD,  murmurs, rubs, gallops or clicks. No pedal edema. Gastrointestinal system: Abdomen is nondistended, soft and nontender. No organomegaly or masses felt. Normal bowel sounds heard. Central nervous system: Alert and oriented. No focal neurological deficits. Extremities: Symmetric 5 x 5 power. Skin: No rashes, lesions or ulcers Psychiatry: Judgement and insight appear normal. Mood & affect appropriate.   Data Reviewed:  Lab results reviewed.  Family Communication: None  Disposition: Status is: Inpatient Remains inpatient appropriate because: Severity of disease.  Planned Discharge Destination: Home    Time spent: 35 minutes  Author: , MD 11/25/2022 1:17 PM  For on call review [email or web address].

## 2022-11-26 DIAGNOSIS — Z6841 Body Mass Index (BMI) 40.0 and over, adult: Secondary | ICD-10-CM | POA: Diagnosis not present

## 2022-11-26 DIAGNOSIS — J9601 Acute respiratory failure with hypoxia: Secondary | ICD-10-CM | POA: Diagnosis not present

## 2022-11-26 DIAGNOSIS — J180 Bronchopneumonia, unspecified organism: Secondary | ICD-10-CM | POA: Diagnosis not present

## 2022-11-26 MED ORDER — ALBUTEROL SULFATE HFA 108 (90 BASE) MCG/ACT IN AERS: 2.0000 | INHALATION_SPRAY | Freq: Four times a day (QID) | RESPIRATORY_TRACT | 0 refills | Status: AC | PRN

## 2022-11-26 MED ORDER — GUAIFENESIN 100 MG/5ML PO LIQD: 5.0000 mL | ORAL | 0 refills | Status: AC | PRN

## 2022-11-26 NOTE — Plan of Care (Signed)

## 2022-11-26 NOTE — Progress Notes (Signed)
SATURATION QUALIFICATIONS: (This note is used to comply with regulatory documentation for home oxygen)  Patient Saturations on Room Air at Rest = 96%  Patient Saturations on Room Air while Ambulating = 81%  Patient Saturations on 2 Liters of oxygen while Ambulating = 92%  Please briefly explain why patient needs home oxygen: Pt shows oxygen desaturations below safe threshold when Pt is ambulating on room air. On 2 liters of oxygen while ambulating, Pt able to obtain acceptable oxygen saturation readings.

## 2022-11-26 NOTE — Plan of Care (Signed)
  Problem: Education: Goal: Knowledge of General Education information will improve Description: Including pain rating scale, medication(s)/side effects and non-pharmacologic comfort measures 11/26/2022 1031 by Orma Render, RN Outcome: Adequate for Discharge 11/26/2022 0746 by Orma Render, RN Outcome: Progressing   Problem: Health Behavior/Discharge Planning: Goal: Ability to manage health-related needs will improve 11/26/2022 1031 by Orma Render, RN Outcome: Adequate for Discharge 11/26/2022 0746 by Orma Render, RN Outcome: Progressing   Problem: Clinical Measurements: Goal: Ability to maintain clinical measurements within normal limits will improve 11/26/2022 1031 by Orma Render, RN Outcome: Adequate for Discharge 11/26/2022 0746 by Orma Render, RN Outcome: Progressing Goal: Will remain free from infection 11/26/2022 1031 by Orma Render, RN Outcome: Adequate for Discharge 11/26/2022 0746 by Orma Render, RN Outcome: Progressing Goal: Diagnostic test results will improve 11/26/2022 1031 by Orma Render, RN Outcome: Adequate for Discharge 11/26/2022 0746 by Orma Render, RN Outcome: Progressing Goal: Respiratory complications will improve 11/26/2022 1031 by Orma Render, RN Outcome: Adequate for Discharge 11/26/2022 0746 by Orma Render, RN Outcome: Progressing Goal: Cardiovascular complication will be avoided 11/26/2022 1031 by Orma Render, RN Outcome: Adequate for Discharge 11/26/2022 0746 by Orma Render, RN Outcome: Progressing   Problem: Activity: Goal: Risk for activity intolerance will decrease 11/26/2022 1031 by Orma Render, RN Outcome: Adequate for Discharge 11/26/2022 0746 by Orma Render, RN Outcome: Progressing   Problem: Nutrition: Goal: Adequate nutrition will be maintained 11/26/2022 1031 by Orma Render, RN Outcome: Adequate for Discharge 11/26/2022 0746 by Orma Render, RN Outcome: Progressing   Problem:  Coping: Goal: Level of anxiety will decrease 11/26/2022 1031 by Orma Render, RN Outcome: Adequate for Discharge 11/26/2022 0746 by Orma Render, RN Outcome: Progressing   Problem: Elimination: Goal: Will not experience complications related to bowel motility 11/26/2022 1031 by Orma Render, RN Outcome: Adequate for Discharge 11/26/2022 0746 by Orma Render, RN Outcome: Progressing Goal: Will not experience complications related to urinary retention 11/26/2022 1031 by Orma Render, RN Outcome: Adequate for Discharge 11/26/2022 0746 by Orma Render, RN Outcome: Progressing   Problem: Pain Managment: Goal: General experience of comfort will improve 11/26/2022 1031 by Orma Render, RN Outcome: Adequate for Discharge 11/26/2022 0746 by Orma Render, RN Outcome: Progressing   Problem: Safety: Goal: Ability to remain free from injury will improve 11/26/2022 1031 by Orma Render, RN Outcome: Adequate for Discharge 11/26/2022 0746 by Orma Render, RN Outcome: Progressing   Problem: Skin Integrity: Goal: Risk for impaired skin integrity will decrease 11/26/2022 1031 by Orma Render, RN Outcome: Adequate for Discharge 11/26/2022 0746 by Orma Render, RN Outcome: Progressing

## 2022-11-26 NOTE — TOC Transition Note (Signed)
Transition of Care [facility]) - CM/SW Discharge Note   Patient Details  Name: Erin [name] MRN: [id number] Date of Birth: 1970-12-22  [facility] CM/SW Contact:  Liliana Cline, LCSW Phone Number: 11/26/2022, 10:32 AM   Clinical Narrative:    Patient has orders to DC home today. Needs home o2 per MD.  Home o2 ordered through [facility] with [employer] who is aware of DC home today.    Final next level of care: Home/Self Care Barriers to Discharge: Barriers Resolved   Patient Goals and CMS Choice        Discharge Placement                       Discharge Plan and Services   Discharge Planning Services: CM Consult            DME Arranged: Oxygen DME Agency: [employer] Date DME Agency Contacted: 11/26/22   Representative spoke with at DME Agency: Leavy Cella            Social Determinants of Health (SDOH) Interventions     Readmission Risk Interventions     No data to display

## 2022-11-26 NOTE — Discharge Summary (Addendum)
Physician Discharge Summary   Patient: Erin [name] MRN: [id number] DOB: 01-10-1971  Admit date:     11/22/2022  Discharge date: 11/26/22  Discharge Physician: Marrion Coy   PCP: [facility]   Recommendations at discharge:   Follow-up with PCP as outpatient.  Discharge Diagnoses: Principal Problem:   Bronchopneumonia Active Problems:   Acute respiratory failure with hypoxia (HCC)   History of asthma   Morbid obesity with BMI of 50.0-59.9, adult (HCC)   At risk for obstructive sleep apnea   Acute hypoxemic respiratory failure (HCC)   Pneumonia due to human metapneumovirus  Resolved Problems:   * No resolved hospital problems. [facility] Course: Ms. Erin [name] is a 51 year old female with history of asthma, morbid obesity, who presents emergency department for chief concerns of shortness of breath. She is requiring 10 L oxygen for significant hypoxemia.  Positive for human metapneumovirus. CT angiogram chest did not show PE, showed diffuse peribronchial thickening with patchy irregular centrilobular nodularity in both upper and lower lobes, concerning for bronchopneumonia.  Patient was placed on Rocephin and Zithromax, also placed on Solu-Medrol.  Assessment and Plan:  Bronchopneumonia secondary to metapneumovirus infection. Acute respiratory failure with hypoxia (HCC) asthma Patient condition much improved, she has good saturation on room air at rest.  However, oxygen saturation dropped down to 81% with walking.  Overall condition has improved, will prescribe home oxygen.  Follow-up with PCP as outpatient. Antibiotics completed. Patient had diagnosis of asthma, but no current exacerbation.   At risk for obstructive sleep apnea Morbid obesity with BMI of 50.0-59.9, adult (HCC) Diet and exercise advised.       Consultants: None Procedures performed: None  Disposition: Home Diet recommendation:  Discharge Diet Orders (From  admission, onward)     Start     Ordered   11/26/22 0000  Diet - low sodium heart healthy        11/26/22 1025           Cardiac diet DISCHARGE MEDICATION: Allergies as of 11/26/2022       Reactions   Percocet [oxycodone-acetaminophen] Itching   "Under the skin bugs"        Medication List     TAKE these medications    albuterol 108 (90 Base) MCG/ACT inhaler Commonly known as: VENTOLIN HFA Inhale 2 puffs into the lungs every 6 (six) hours as needed for wheezing or shortness of breath.   fexofenadine 180 MG tablet Commonly known as: ALLEGRA Take 180 mg by mouth daily.   Fluticasone-Salmeterol 250-50 MCG/DOSE Aepb Commonly known as: Advair Diskus Inhale 1 puff into the lungs 2 (two) times daily.   guaiFENesin 100 MG/5ML liquid Commonly known as: ROBITUSSIN Take 5 mLs by mouth every 4 (four) hours as needed for cough or to loosen phlegm.               Durable Medical Equipment  (From admission, onward)           Start     Ordered   11/26/22 1019  For home use only DME oxygen  Once       Question Answer Comment  Length of Need 6 Months   Mode or (Route) Nasal cannula   Liters per Minute 2   Frequency Continuous (stationary and portable oxygen unit needed)   Oxygen conserving device Yes   Oxygen delivery system Gas      11/26/22 1019  Follow-up Information     [facility] Follow up in 1 week(s).   Specialty: General Practice Contact information: [address] [place] [address] [phone]                Discharge Exam: Ceasar Mons Weights   11/22/22 1300  Weight: 120.2 kg   General exam: Appears calm and comfortable, morbidly obese. Respiratory system: Clear to auscultation. Respiratory effort normal. Cardiovascular system: S1 & S2 heard, RRR. No JVD, murmurs, rubs, gallops or clicks. No pedal edema. Gastrointestinal system: Abdomen is nondistended, soft and nontender. No  organomegaly or masses felt. Normal bowel sounds heard. Central nervous system: Alert and oriented. No focal neurological deficits. Extremities: Symmetric 5 x 5 power. Skin: No rashes, lesions or ulcers Psychiatry: Judgement and insight appear normal. Mood & affect appropriate.    Condition at discharge: good  The results of significant diagnostics from this hospitalization (including imaging, microbiology, ancillary and laboratory) are listed below for reference.   Imaging Studies: CT Angio Chest PE W and/or Wo Contrast  Result Date: 11/22/2022 CLINICAL DATA:  Progressively worsening cough and shortness of breath over the past 2-3 days. History of asthma. EXAM: CT ANGIOGRAPHY CHEST WITH CONTRAST TECHNIQUE: Multidetector CT imaging of the chest was performed using the standard protocol during bolus administration of intravenous contrast. Multiplanar CT image reconstructions and MIPs were obtained to evaluate the vascular anatomy. RADIATION DOSE REDUCTION: This exam was performed according to the departmental dose-optimization program which includes automated exposure control, adjustment of the mA and/or kV according to patient size and/or use of iterative reconstruction technique. CONTRAST:  OMNIPAQUE IOHEXOL 350 MG/ML SOLN COMPARISON:  CT chest dated April 09, 2018. FINDINGS: Cardiovascular: Satisfactory opacification of the pulmonary arteries to the segmental level. No evidence of pulmonary embolism. Normal heart size. No pericardial effusion. No thoracic aortic aneurysm or dissection. Mediastinum/Nodes: No enlarged mediastinal, hilar, or axillary lymph nodes. Thyroid gland, trachea, and esophagus demonstrate no significant findings. Lungs/Pleura: Diffuse peribronchial thickening. Patchy irregular centrilobular nodularity in both upper and lower lobes. Partial collapse of the lingula. Subsegmental atelectasis in the right middle lobe. No pleural effusion or pneumothorax. Upper Abdomen: No  acute abnormality. Unchanged diffusely decreased liver density. Musculoskeletal: No chest wall abnormality. No acute or significant osseous findings. Unchanged T5-T7 ankylosis. Review of the MIP images confirms the above findings. IMPRESSION: 1. No evidence of pulmonary embolism. 2. Diffuse peribronchial thickening with patchy irregular centrilobular nodularity in both upper and lower lobes, concerning for bronchopneumonia. 3. Partial collapse of the lingula. 4. Unchanged hepatic steatosis. Electronically Signed   By: Obie Dredge M.D.   On: 11/22/2022 17:18   DG Chest 2 View  Result Date: 11/22/2022 CLINICAL DATA:  Shortness of breath.  Low pulse oxygenation. EXAM: CHEST - 2 VIEW COMPARISON:  Chest two views 04/09/2018 and 02/21/2016, CT chest 04/09/2018 FINDINGS: Cardiac silhouette is normal in size. Unchanged prominence of the right hilum from 04/09/2018 and 02/21/2016 radiographs. This appears to correspond to the normal right pulmonary artery interlobar branch seen on prior CT. The lungs are clear. No pleural effusion or pneumothorax. Moderate anterior T6 vertebral body height loss is unchanged from multiple prior radiographs and 04/09/2018 CT. 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 Mild dextrocurvature of the upper thoracic spine and minimal levocurvature of the mid to lower thoracic spine. IMPRESSION: 1. No active cardiopulmonary disease. 2. Moderate anterior T6 vertebral body height loss is unchanged from  multiple prior radiographs and 04/09/2018 CT. 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 Electronically Signed   By: Neita Garnet M.D.   On: 11/22/2022 11:09    Microbiology: Results for orders placed or performed during the hospital encounter of 11/22/22  Respiratory (~20 pathogens) panel by PCR     Status: Abnormal   Collection Time: 11/22/22  6:49 PM   Specimen: Nasopharyngeal Swab; Respiratory  Result Value  Ref Range Status   Adenovirus NOT DETECTED NOT DETECTED Final   Coronavirus 229E NOT DETECTED NOT DETECTED Final    Comment: (NOTE) The Coronavirus on the Respiratory Panel, DOES NOT test for the novel  Coronavirus (2019 nCoV)    Coronavirus HKU1 NOT DETECTED NOT DETECTED Final   Coronavirus NL63 NOT DETECTED NOT DETECTED Final   Coronavirus OC43 NOT DETECTED NOT DETECTED Final   Metapneumovirus DETECTED (A) NOT DETECTED Final   Rhinovirus / Enterovirus NOT DETECTED NOT DETECTED Final   Influenza A NOT DETECTED NOT DETECTED Final   Influenza B NOT DETECTED NOT DETECTED Final   Parainfluenza Virus 1 NOT DETECTED NOT DETECTED Final   Parainfluenza Virus 2 NOT DETECTED NOT DETECTED Final   Parainfluenza Virus 3 NOT DETECTED NOT DETECTED Final   Parainfluenza Virus 4 NOT DETECTED NOT DETECTED Final   Respiratory Syncytial Virus NOT DETECTED NOT DETECTED Final   Bordetella pertussis NOT DETECTED NOT DETECTED Final   Bordetella Parapertussis NOT DETECTED NOT DETECTED Final   Chlamydophila pneumoniae NOT DETECTED NOT DETECTED Final   Mycoplasma pneumoniae NOT DETECTED NOT DETECTED Final    Comment: Performed at [facility], [address] [place], [place] [address]    Labs: CBC: Recent Labs  Lab 11/22/22 1333 11/23/22 0532  WBC 6.9 6.5  HGB 13.6 14.2  HCT 43.0 45.7  MCV 84.8 87.0  PLT 149* 171   Basic Metabolic Panel: Recent Labs  Lab 11/22/22 1555 11/23/22 0532  NA 137 141  K 4.1 4.9  CL 100 104  CO2 27 30  GLUCOSE 208* 162*  BUN 16 14  CREATININE 0.70 0.59  CALCIUM 8.9 9.1  MG 2.6*  --   PHOS 3.9  --    Liver Function Tests: Recent Labs  Lab 11/22/22 1555  AST 48*  ALT 38  ALKPHOS 98  BILITOT 0.9  PROT 7.9  ALBUMIN 4.2   CBG: No results for input(s): "GLUCAP" in the last 168 hours.  Discharge time spent: greater than 30 minutes.  Signed: Marrion Coy, MD [facility] 11/26/2022

## 2023-02-03 ENCOUNTER — Ambulatory Visit: Payer: Self-pay | Admitting: Family Medicine

## 2023-04-19 ENCOUNTER — Ambulatory Visit: Payer: BC Managed Care – PPO | Admitting: Family Medicine
